# Patient Record
Sex: Male | Born: 1965 | Race: White | Hispanic: No | Marital: Single | Smoking: Never smoker
Health system: Southern US, Community
[De-identification: ages and names within clinical notes are randomized; demographics above are authoritative.]

## PROBLEM LIST (undated history)

## (undated) DIAGNOSIS — I4891 Unspecified atrial fibrillation: Secondary | ICD-10-CM

## (undated) DIAGNOSIS — D649 Anemia, unspecified: Secondary | ICD-10-CM

## (undated) DIAGNOSIS — M1990 Unspecified osteoarthritis, unspecified site: Secondary | ICD-10-CM

## (undated) DIAGNOSIS — N529 Male erectile dysfunction, unspecified: Secondary | ICD-10-CM

## (undated) DIAGNOSIS — D869 Sarcoidosis, unspecified: Secondary | ICD-10-CM

## (undated) DIAGNOSIS — I10 Essential (primary) hypertension: Secondary | ICD-10-CM

## (undated) HISTORY — PX: SHOULDER SURGERY: SHX246

## (undated) HISTORY — DX: Essential (primary) hypertension: I10

## (undated) HISTORY — PX: VASECTOMY: SHX75

## (undated) HISTORY — PX: OTHER SURGICAL HISTORY: SHX169

---

## 1988-05-07 HISTORY — PX: KNEE SURGERY: SHX244

## 2001-05-07 DIAGNOSIS — D869 Sarcoidosis, unspecified: Secondary | ICD-10-CM

## 2001-05-07 HISTORY — DX: Sarcoidosis, unspecified: D86.9

## 2012-02-15 ENCOUNTER — Ambulatory Visit: Payer: Self-pay | Admitting: Internal Medicine

## 2015-04-07 ENCOUNTER — Emergency Department (HOSPITAL_COMMUNITY)
Admission: EM | Admit: 2015-04-07 | Discharge: 2015-04-07 | Disposition: A | Discharge status: Home / Self Care | Payer: BLUE CROSS/BLUE SHIELD | Attending: Emergency Medicine | Admitting: Emergency Medicine

## 2015-04-07 ENCOUNTER — Encounter (HOSPITAL_COMMUNITY): Payer: Self-pay | Admitting: *Deleted

## 2015-04-07 ENCOUNTER — Emergency Department (HOSPITAL_COMMUNITY): Payer: BLUE CROSS/BLUE SHIELD

## 2015-04-07 DIAGNOSIS — R000 Tachycardia, unspecified: Secondary | ICD-10-CM | POA: Diagnosis present

## 2015-04-07 DIAGNOSIS — Z862 Personal history of diseases of the blood and blood-forming organs and certain disorders involving the immune mechanism: Secondary | ICD-10-CM | POA: Diagnosis not present

## 2015-04-07 DIAGNOSIS — I4891 Unspecified atrial fibrillation: Secondary | ICD-10-CM | POA: Diagnosis not present

## 2015-04-07 HISTORY — DX: Sarcoidosis, unspecified: D86.9

## 2015-04-07 HISTORY — DX: Anemia, unspecified: D64.9

## 2015-04-07 LAB — CBC WITH DIFFERENTIAL/PLATELET
Basophils Absolute: 0 10*3/uL (ref 0.0–0.1)
Basophils Relative: 0 %
EOS ABS: 0 10*3/uL (ref 0.0–0.7)
EOS PCT: 1 %
HCT: 36.4 % — ABNORMAL LOW (ref 39.0–52.0)
Hemoglobin: 11.8 g/dL — ABNORMAL LOW (ref 13.0–17.0)
LYMPHS ABS: 1.4 10*3/uL (ref 0.7–4.0)
LYMPHS PCT: 20 %
MCH: 30.5 pg (ref 26.0–34.0)
MCHC: 32.4 g/dL (ref 30.0–36.0)
MCV: 94.1 fL (ref 78.0–100.0)
MONO ABS: 0.5 10*3/uL (ref 0.1–1.0)
Monocytes Relative: 6 %
Neutro Abs: 5.3 10*3/uL (ref 1.7–7.7)
Neutrophils Relative %: 73 %
Platelets: 209 10*3/uL (ref 150–400)
RBC: 3.87 MIL/uL — AB (ref 4.22–5.81)
RDW: 12.7 % (ref 11.5–15.5)
WBC: 7.2 10*3/uL (ref 4.0–10.5)

## 2015-04-07 LAB — BASIC METABOLIC PANEL
ANION GAP: 8 (ref 5–15)
BUN: 8 mg/dL (ref 6–20)
CALCIUM: 8.1 mg/dL — AB (ref 8.9–10.3)
CO2: 25 mmol/L (ref 22–32)
Chloride: 106 mmol/L (ref 101–111)
Creatinine, Ser: 0.81 mg/dL (ref 0.61–1.24)
GFR calc Af Amer: >60 mL/min (ref >60)
Glucose, Bld: 91 mg/dL (ref 65–99)
POTASSIUM: 3.5 mmol/L (ref 3.5–5.1)
SODIUM: 139 mmol/L (ref 135–145)

## 2015-04-07 MED ORDER — DILTIAZEM HCL 25 MG/5ML IV SOLN
20.0000 mg | Freq: Once | INTRAVENOUS | Status: AC
  Administered 2015-04-07: 20 mg via INTRAVENOUS
  Filled 2015-04-07: qty 5

## 2015-04-07 MED ORDER — RIVAROXABAN 15 MG PO TABS: 15.0000 mg | ORAL_TABLET | Freq: Every day | ORAL | Status: DC

## 2015-04-07 MED ORDER — PROPOFOL 10 MG/ML IV BOLUS
1.0000 mg/kg | Freq: Once | INTRAVENOUS | Status: DC
  Filled 2015-04-07: qty 20

## 2015-04-07 MED ORDER — DILTIAZEM HCL ER COATED BEADS 120 MG PO CP24: 120.0000 mg | ORAL_CAPSULE | Freq: Every day | ORAL | Status: DC

## 2015-04-07 MED ORDER — RIVAROXABAN 15 MG PO TABS
15.0000 mg | ORAL_TABLET | Freq: Once | ORAL | Status: AC
  Administered 2015-04-07: 15 mg via ORAL
  Filled 2015-04-07: qty 1

## 2015-04-07 MED ORDER — PROPOFOL 10 MG/ML IV BOLUS
INTRAVENOUS | Status: AC | PRN
  Administered 2015-04-07: 122.5 mg via INTRAVENOUS

## 2015-04-07 MED ORDER — DILTIAZEM HCL ER COATED BEADS 120 MG PO CP24
120.0000 mg | ORAL_CAPSULE | Freq: Once | ORAL | Status: AC
  Administered 2015-04-07: 120 mg via ORAL
  Filled 2015-04-07: qty 1

## 2015-04-07 NOTE — ED Notes (Signed)
Pt states he began feeling like his chest was fluttering last night. Pt states it continued to "feel weird" and was only able to work 1/2 a day. Pt endorses SOB, but denies pain, diaphoresis or n/v. This is a new onset of afib, pt states he has had an irregular hr, but not afib.

## 2015-04-07 NOTE — Discharge Instructions (Signed)
Atrial Fibrillation Take the medication prescribed starting tomorrow. The cardiologist's office should call you to be seen within the next 1 or 2 weeks. If you don't hear from them by tomorrow afternoon, call to schedule an appointment. Tell office staff with Dr.Skains spoke with Dr.Nattaly Yebra, and that you need to be seen within 1 or 2 weeks. Return if you feel worse for any reason Atrial fibrillation is a type of heartbeat that is irregular or fast (rapid). If you have this condition, your heart keeps quivering in a weird (chaotic) way. This condition can make it so your heart cannot pump blood normally. Having this condition gives a person more risk for stroke, heart failure, and other heart problems. There are different types of atrial fibrillation. Talk with your doctor to learn about the type that you have. HOME CARE  Take over-the-counter and prescription medicines only as told by your doctor.  If your doctor prescribed a blood-thinning medicine, take it exactly as told. Taking too much of it can cause bleeding. If you do not take enough of it, you will not have the protection that you need against stroke and other problems.  Do not use any tobacco products. These include cigarettes, chewing tobacco, and e-cigarettes. If you need help quitting, ask your doctor.  If you have apnea (obstructive sleep apnea), manage it as told by your doctor.  Do not drink alcohol.  Do not drink beverages that have caffeine. These include coffee, soda, and tea.  Maintain a healthy weight. Do not use diet pills unless your doctor says they are safe for you. Diet pills may make heart problems worse.  Follow diet instructions as told by your doctor.  Exercise regularly as told by your doctor.  Keep all follow-up visits as told by your doctor. This is important. GET HELP IF:  You notice a change in the speed, rhythm, or strength of your heartbeat.  You are taking a blood-thinning medicine and you notice  more bruising.  You get tired more easily when you move or exercise. GET HELP RIGHT AWAY IF:  You have pain in your chest or your belly (abdomen).  You have sweating or weakness.  You feel sick to your stomach (nauseous).  You notice blood in your throw up (vomit), poop (stool), or pee (urine).  You are short of breath.  You suddenly have swollen feet and ankles.  You feel dizzy.  Your suddenly get weak or numb in your face, arms, or legs, especially if it happens on one side of your body.  You have trouble talking, trouble understanding, or both.  Your face or your eyelid droops on one side. These symptoms may be an emergency. Do not wait to see if the symptoms will go away. Get medical help right away. Call your local emergency services (911 in the [place] Do not drive yourself to the hospital.   This information is not intended to replace advice given to you by your health care provider. Make sure you discuss any questions you have with your health care provider.   Document Released: 01/31/2008 Document Revised: 01/12/2015 Document Reviewed: 08/18/2014 Elsevier Interactive Patient Education [employer]

## 2015-04-07 NOTE — ED Notes (Signed)
Pt left with all his belongings and ambulated out of the treatment area.  

## 2015-04-07 NOTE — ED Provider Notes (Addendum)
CSN: [id number]     Arrival date & time 04/07/15  1700 History   First MD Initiated Contact with Patient 04/07/15 1719     Chief Complaint  Patient presents with  . Tachycardia     (Consider location/radiation/quality/duration/timing/severity/associated sxs/prior Treatment) HPI C/o of "feeling tired" and his heart fluttering in his chest with rapid heartbeat onset 12 midnight yesterday.. Nothing makes symptoms better worse. He denies any chest pain denies is of breath. No other associated symptoms. He was seen at his primary care physician's office today to determine to be in atrial fib with rapid ventricular response. Sent here for further evaluation Past Medical History  Diagnosis Date  . Sarcoidosis (HCC) 2003  . Anemia    History reviewed. No pertinent past surgical history. History reviewed. No pertinent family history. Social History  Substance Use Topics  . Smoking status: Never Smoker   . Smokeless tobacco: Never Used  . Alcohol Use: 24.6 oz/week    41 Cans of beer per week    Review of Systems  HENT: Negative.   Respiratory: Negative.   Cardiovascular: Positive for palpitations.  Gastrointestinal: Negative.   Musculoskeletal: Negative.   Skin: Negative.   Neurological: Positive for weakness.       Generalized weakness  Psychiatric/Behavioral: Negative.   All other systems reviewed and are negative.     Allergies  Review of patient's allergies indicates no known allergies.  Home Medications   Prior to Admission medications   Not on File   BP 137/107 mmHg  Pulse 69  Temp(Src) 98.8 F (37.1 C) (Oral)  Resp 12  Ht  (1.905 m)  Wt 270 lb (122.471 kg)  BMI 33.75 kg/m2  SpO2 97% Physical Exam  Constitutional: He appears well-developed and well-nourished.  HENT:  Head: Normocephalic and atraumatic.  Eyes: Conjunctivae are normal. Pupils are equal, round, and reactive to light.  Neck: Neck supple. No tracheal deviation present. No thyromegaly  present.  Cardiovascular:  No murmur heard. Tachycardic irregularly irregular  Pulmonary/Chest: Effort normal and breath sounds normal.  Abdominal: Soft. Bowel sounds are normal. He exhibits no distension. There is no tenderness.  Musculoskeletal: Normal range of motion. He exhibits no edema or tenderness.  Neurological: He is alert. Coordination normal.  Skin: Skin is warm and dry. No rash noted.  Psychiatric: He has a normal mood and affect.  Nursing note and vitals reviewed.   ED Course  .Sedation Date/Time: 04/07/2015 8:06 PM Performed by: Doug Sou Authorized by: Doug Sou  Consent:    Consent obtained:  Written   Consent given by:  Patient   Risks discussed:  Allergic reaction and prolonged hypoxia resulting in organ damage Indications:    Sedation purpose:  Cardioversion   Procedure necessitating sedation performed by:  Physician performing sedation   Intended level of sedation:  Deep Pre-sedation assessment:    Time since last food or drink:  11 am today   ASA classification: class 2 - patient with mild systemic disease     Neck mobility: normal     Mouth opening:  3 or more finger widths   Thyromental distance:  3 finger widths   Mallampati score:  I - soft palate, uvula, fauces, pillars visible   Pre-sedation assessments completed and reviewed: airway patency, cardiovascular function, hydration status, mental status and respiratory function     Pre-sedation assessment completed:  04/07/2015 7:43 PM Immediate pre-procedure details:    Reassessment: Patient reassessed immediately prior to procedure     Reviewed: vital  signs, relevant labs/tests and NPO status     Verified: bag valve mask available, intubation equipment available and oxygen available   Procedure details (see MAR for exact dosages):    Sedation start time:  04/07/2015 7:43 PM   Preoxygenation:  Nasal cannula   Sedation:  Propofol   Analgesia:  None   Intra-procedure monitoring:  Blood  pressure monitoring, continuous pulse oximetry, frequent LOC assessments and frequent vital sign checks   Intra-procedure events: hypoxia     Intra-procedure management:  Supplemental oxygen Post-procedure details:    Post-sedation assessment completed:  04/07/2015 8:11 PM   Attendance: Constant attendance by certified staff until patient recovered     Recovery: Patient returned to pre-procedure baseline     Post-sedation assessments completed and reviewed: airway patency, cardiovascular function, mental status, pain level and respiratory function     Patient is stable for discharge or admission: Yes     Patient tolerance:  Tolerated well, no immediate complications Comments:      Pulse ox desated to 89 % for < 1 minute .  (including critical care time) correction pulse ox desatted 89% for less than 1 minute during procedure. Corrected to 95% with supplemental oxygen administered via nasal prong Labs Review Labs Reviewed  CBC WITH DIFFERENTIAL/PLATELET  BASIC METABOLIC PANEL    Imaging Review No results found. I have personally reviewed and evaluated these images and lab results as part of my medical decision-making.   EKG Interpretation   Date/Time:  Thursday April 07 2015 18:00:27 EST Ventricular Rate:  145 PR Interval:    QRS Duration: 86 QT Interval:  294 QTC Calculation: 457 R Axis:   62 Text Interpretation:  Atrial fibrillation Anteroseptal infarct, age  indeterminate No old tracing to compare Confirmed by Coleen Cardiff  MD, Hawkins Seaman  [phone]) on 04/07/2015 6:10:26 PM      ED ECG REPORT   Date: 04/07/2015   1956 pm  Rate: 95  Rhythm: normal sinus rhythm  QRS Axis: normal  Intervals: normal  ST/T Wave abnormalities: normal  Conduction Disutrbances:none  Narrative Interpretation:   Old EKG Reviewed: changes noted Atrial fibrillation has resolved I have personally reviewed the EKG tracing and disagree with the computerized printout as noted. Doubt posterior MI 173 9 PM  patient received Cardizem 20 mg IV, my order at 1940 4 PM patient is asymptomatic pulse 129 respirations 16 blood pressure 130/93. Patient was subsequently cardioverted by me. Consent obtained, timeout performed ASA category 2. Last oral intake 11 AM today  Results for orders placed or performed during the hospital encounter of 04/07/15  CBC with Differential/Platelet  Result Value Ref Range   WBC 7.2 4.0 - 10.5 K/uL   RBC 3.87 (L) 4.22 - 5.81 MIL/uL   Hemoglobin 11.8 (L) 13.0 - 17.0 g/dL   HCT 19.3 (L) 79.0 - 24.0 %   MCV 94.1 78.0 - 100.0 fL   MCH 30.5 26.0 - 34.0 pg   MCHC 32.4 30.0 - 36.0 g/dL   RDW 97.3 53.2 - 99.2 %   Platelets 209 150 - 400 K/uL   Neutrophils Relative % 73 %   Neutro Abs 5.3 1.7 - 7.7 K/uL   Lymphocytes Relative 20 %   Lymphs Abs 1.4 0.7 - 4.0 K/uL   Monocytes Relative 6 %   Monocytes Absolute 0.5 0.1 - 1.0 K/uL   Eosinophils Relative 1 %   Eosinophils Absolute 0.0 0.0 - 0.7 K/uL   Basophils Relative 0 %   Basophils Absolute 0.0 0.0 -  0.1 K/uL  Basic metabolic panel  Result Value Ref Range   Sodium 139 135 - 145 mmol/L   Potassium 3.5 3.5 - 5.1 mmol/L   Chloride 106 101 - 111 mmol/L   CO2 25 22 - 32 mmol/L   Glucose, Bld 91 65 - 99 mg/dL   BUN 8 6 - 20 mg/dL   Creatinine, Ser 1.61 0.61 - 1.24 mg/dL   Calcium 8.1 (L) 8.9 - 10.3 mg/dL   GFR calc non Af Amer >60 >60 mL/min   GFR calc Af Amer >60 >60 mL/min   Anion gap 8 5 - 15   Dg Chest Port 1 View  04/07/2015  CLINICAL DATA:  Palpitations, shortness of breath for 1 day EXAM: PORTABLE CHEST 1 VIEW COMPARISON:  None. FINDINGS: The heart size and mediastinal contours are within normal limits. Both lungs are clear. The visualized skeletal structures are unremarkable. IMPRESSION: No active disease. Electronically Signed   By: Natasha Mead M.D.   On: 04/07/2015 18:35    MDM  Spoke with Dr. Roe [place] prescription Cardizem CD 120 mg daily for one month,xarelto 15 mg daily for one month. Cardiology office  will see patient within the next 1-2 weeks Final diagnoses:  None   Diagnosis Atrial fibrillation with rapid ventricular response    Doug Sou, MD 04/07/15 2029  Doug Sou, MD 04/07/15 2029  Doug Sou, MD 04/07/15 2039

## 2015-04-08 ENCOUNTER — Telehealth: Payer: Self-pay | Admitting: *Deleted

## 2015-04-08 NOTE — Telephone Encounter (Signed)
Spoke with pt to follow up with him and how he is feeling.  He reports being very tired today.  He was at the pharmacy to pick up the medications he was started on last night while at the ED.  He was scheduled for f/u with Dr Anne Fu on 12/28.  Dr Anne Fu states OK for pt to been seen then but to call back if further concerns or problems prior to then.

## 2015-04-25 ENCOUNTER — Encounter: Payer: Self-pay | Admitting: Cardiology

## 2015-04-27 ENCOUNTER — Ambulatory Visit: Payer: BLUE CROSS/BLUE SHIELD | Admitting: Physician Assistant

## 2015-05-04 ENCOUNTER — Ambulatory Visit (INDEPENDENT_AMBULATORY_CARE_PROVIDER_SITE_OTHER): Payer: BLUE CROSS/BLUE SHIELD | Admitting: Cardiology

## 2015-05-04 ENCOUNTER — Encounter: Payer: Self-pay | Admitting: Cardiology

## 2015-05-04 VITALS — BP 150/84 | HR 87 | Ht 75.0 in | Wt 271.4 lb

## 2015-05-04 DIAGNOSIS — I480 Paroxysmal atrial fibrillation: Secondary | ICD-10-CM

## 2015-05-04 DIAGNOSIS — E669 Obesity, unspecified: Secondary | ICD-10-CM

## 2015-05-04 MED ORDER — DILTIAZEM HCL ER COATED BEADS 240 MG PO CP24: 240.0000 mg | ORAL_CAPSULE | Freq: Every day | ORAL | Status: DC

## 2015-05-04 NOTE — Progress Notes (Signed)
Cardiology Office Note   Date:  05/04/2015   ID:  Gerald Robles, DOB Dec 13, 1965, MRN [id number]  PCP:  Pcp Not In System  Cardiologist:   Donato Schultz, MD       History of Present Illness: Gerald Robles is a 49 y.o. male who presents  Evaluation of atrial fibrillation, paroxysmal.  At the request of Dr. Kevan Ny. Cardizem CD 120 mg once a day was given after cardioversion took place. He has been on Xarelto prescribed for one month post cardioversion.   he admits that over Thanksgiving , he had been partying a bit much. Increased alcohol use. His heart rate was increased with his girlfriend.  Noticed some shortness of breath. Went to sleep. Next morning still elevated. Went to the emergency room. No chest pain, no syncope , no fevers. He has cut back on alcohol use , caffeine use.   Since cardioversion on 04/07/15 he has felt better. Rare palpitations.  Back in 2006  in the [employer]	[place], he developed sarcoidosis and was treated with prednisone and indomethacin. During that experience he remembers getting a transesophageal echocardiogram which he remembers having an ejection fraction of 45%, mildly reduced.  He knows Olegario Messier, Dr. Kevan Ny nurse    Past Medical History  Diagnosis Date  . Sarcoidosis (HCC) 2003  . Anemia   . Hypertension   . Anemia     Past Surgical History  Procedure Laterality Date  . Knee surgery Bilateral 1990  . Shoulder surgery      twice     Current Outpatient Prescriptions  Medication Sig Dispense Refill  . diclofenac (VOLTAREN) 25 MG EC tablet Take 25 mg by mouth 2 (two) times daily.    Marland Kitchen diltiazem (CARDIZEM CD) 240 MG 24 hr capsule Take 1 capsule (240 mg total) by mouth daily. 30 capsule 11  . Rivaroxaban (XARELTO) 15 MG TABS tablet Take 1 tablet (15 mg total) by mouth daily. 30 tablet 0   No current facility-administered medications for this visit.    Allergies:   Review of patient's allergies indicates no known allergies.    Social History:  The  patient  reports that he has never smoked. He has never used smokeless tobacco. He reports that he drinks about 24.6 oz of alcohol per week. He reports that he uses illicit drugs (Marijuana).   Family History:  The patient's family history includes Hypertension in his mother.    ROS:  Please see the history of present illness.   Otherwise, review of systems are positive for none.   All other systems are reviewed and negative.    PHYSICAL EXAM: VS:  BP 150/84 mmHg  Pulse 87  Ht  (1.905 m)  Wt 271 lb 6.4 oz (123.106 kg)  BMI 33.92 kg/m2  SpO2 96% , BMI Body mass index is 33.92 kg/(m^2). GEN: Well nourished, well developed, in no acute distress HEENT: normal Neck: no JVD, carotid bruits, or masses Cardiac: RRR; no murmurs, rubs, or gallops,no edema  Respiratory:  clear to auscultation bilaterally, normal work of breathing GI: soft, nontender, nondistended, + BS MS: no deformity or atrophy Skin: warm and dry, no rash Neuro:  Strength and sensation are intact Psych: euthymic mood, full affect   EKG:  EKG is not ordered today.  EKGs from 04/07/15 personally reviewed and shows atrial fibrillation with rapid ventricular response in the 140 range, second EKG shows sinus rhythm no other abnormalities.     Recent Labs: 04/07/2015: BUN 8; Creatinine, Ser 0.81;  Hemoglobin 11.8*; Platelets 209; Potassium 3.5; Sodium 139    Lipid Panel No results found for: CHOL, TRIG, HDL, CHOLHDL, VLDL, LDLCALC, LDLDIRECT    Wt Readings from Last 3 Encounters:  05/04/15 271 lb 6.4 oz (123.106 kg)  04/07/15 270 lb (122.471 kg)      Other studies Reviewed: Additional studies/ records that were reviewed today include:  Prior emergency room visits reviewed, lab work reviewed , EKG reviewed. Review of the above records demonstrates:  As above   ASSESSMENT AND PLAN:   Paroxysmal atrial fibrillation  -  Cardioversion took place on 04/07/15 in the emergency room  -  Xarelto for one month post  cardioversion  -  Cardizem we will increase to 240 mg once a day  -  He is decreasing alcohol use , caffeine use. Used to drink 8 Coca[employer]Cola's a day.  - CHDS-VASc is 1 ( no long-term anticoagulation)  -  He does not snore   Essential hypertension  -  Increasing diltiazem  -  May need secondary agent  -  Weight loss   Obesity  -  Weight loss  -  This will help with atrial fibrillation as well   Current medicines are reviewed at length with the patient today.  The patient does not have concerns regarding medicines.  The following changes have been made:   Increasing Cardizem  Labs/ tests ordered today include:   Orders Placed This Encounter  Procedures  . Echocardiogram     Disposition:   FU with Myers Tutterow in 3 months  Signed, Donato Schultz, MD  05/04/2015 4:43 PM    [facility] [address] [place], [place], [place]  [address] Phone: [phone]; Fax: [phone]

## 2015-05-04 NOTE — Patient Instructions (Signed)
Medication Instructions:  Please increase your Diltiazem to 240 mg a day. Continue all other medications as listed.  Testing/Procedures: Your physician has requested that you have an echocardiogram. Echocardiography is a painless test that uses sound waves to create images of your heart. It provides your doctor with information about the size and shape of your heart and how well your heart's chambers and valves are working. This procedure takes approximately one hour. There are no restrictions for this procedure.  Follow-Up: Follow up in 3 months with Dr Anne Fu.  If you need a refill on your cardiac medications before your next appointment, please call your pharmacy.  Thank you for choosing [facility]!!

## 2015-05-20 ENCOUNTER — Ambulatory Visit (HOSPITAL_COMMUNITY): Payer: BLUE CROSS/BLUE SHIELD | Attending: Cardiovascular Disease

## 2015-05-20 ENCOUNTER — Other Ambulatory Visit: Payer: Self-pay

## 2015-05-20 DIAGNOSIS — Z6833 Body mass index (BMI) 33.0-33.9, adult: Secondary | ICD-10-CM | POA: Insufficient documentation

## 2015-05-20 DIAGNOSIS — I480 Paroxysmal atrial fibrillation: Secondary | ICD-10-CM

## 2015-05-20 DIAGNOSIS — E669 Obesity, unspecified: Secondary | ICD-10-CM | POA: Diagnosis not present

## 2015-05-20 DIAGNOSIS — I4891 Unspecified atrial fibrillation: Secondary | ICD-10-CM | POA: Diagnosis present

## 2015-05-20 DIAGNOSIS — I517 Cardiomegaly: Secondary | ICD-10-CM | POA: Insufficient documentation

## 2015-05-25 ENCOUNTER — Telehealth: Payer: Self-pay | Admitting: *Deleted

## 2015-05-25 NOTE — Telephone Encounter (Signed)
Notes Recorded by Loa Socks, LPN on 08/21/3843 at 9:01 AM Notified the pt that per Dr Anne Fu, his echo showed mildly reduced EF 45-50 %, dilated left atrium (due to a-fib), and he advises that the pt should continue on his current medication regimen, and watch his ETOH intake.  Pt verbalized understanding and request that Xarelto be removed from his medication list, for this med was discontinued by Dr Anne Fu after one month post cardioversion.  Confirmed this med was discontinued by Dr Anne Fu, noted in his last OV with the pt on 05/04/15.  Informed the pt I will d/c this out of his med list.  Pt verbalized understanding and gracious for all the assistance provided.

## 2015-05-25 NOTE — Telephone Encounter (Signed)
-----   Message from Jake Bathe, MD sent at 05/23/2015  6:52 AM EST ----- Mildly reduced EF 45-50% Dilated left atrium (AFIB) Continue with current medical mgt. Watch ETOH consumption Donato Schultz, MD

## 2015-06-28 ENCOUNTER — Encounter: Payer: Self-pay | Admitting: Cardiology

## 2015-06-28 ENCOUNTER — Ambulatory Visit (INDEPENDENT_AMBULATORY_CARE_PROVIDER_SITE_OTHER): Payer: BLUE CROSS/BLUE SHIELD | Admitting: Cardiology

## 2015-06-28 VITALS — BP 134/86 | HR 76 | Ht 75.0 in | Wt 272.0 lb

## 2015-06-28 DIAGNOSIS — E669 Obesity, unspecified: Secondary | ICD-10-CM | POA: Diagnosis not present

## 2015-06-28 DIAGNOSIS — I480 Paroxysmal atrial fibrillation: Secondary | ICD-10-CM | POA: Diagnosis not present

## 2015-06-28 NOTE — Progress Notes (Signed)
Cardiology Office Note   Date:  06/28/2015   ID:  Gerald Robles, DOB [birth year], MRN [id number]  PCP:  Pcp Not In System  Cardiologist:   Donato Schultz, MD       History of Present Illness: Gerald Robles is a 50 y.o. male who presents  Evaluation of atrial fibrillation, paroxysmal.  At the request of Dr. Kevan Ny. Cardizem CD 120 mg once a day was given after cardioversion took place.Took Xarelto prescribed for one month post cardioversion.  He admits that over Thanksgiving, he had been partying a bit much. Increased alcohol use. His heart rate was increased with his girlfriend.  Noticed some shortness of breath. Went to sleep. Next morning still elevated. Went to the emergency room. No chest pain, no syncope, no fevers. He has cut back on alcohol use , caffeine use.  Since cardioversion on 04/07/15 he has felt better. Rare palpitations. he however feels somewhat run down, more fatigue since being on diltiazem.   Back in 2006  in the [employer]	[place], he developed sarcoidosis and was treated with prednisone and indomethacin. During that experience he remembers getting a transesophageal echocardiogram which he remembers having an ejection fraction of 45, possibly 50 %, mildly reduced. This may been long-standing. He had to get a letter certifying his ability to be on a flights suqadron.  He knows Olegario Messier, Dr. Kevan Ny' nurse.    Past Medical History  Diagnosis Date  . Sarcoidosis (HCC) 2003  . Anemia   . Hypertension   . Anemia     Past Surgical History  Procedure Laterality Date  . Knee surgery Bilateral 1990  . Shoulder surgery      twice     Current Outpatient Prescriptions  Medication Sig Dispense Refill  . diclofenac (VOLTAREN) 25 MG EC tablet Take 25 mg by mouth 2 (two) times daily.    Marland Kitchen diltiazem (CARDIZEM CD) 240 MG 24 hr capsule Take 1 capsule (240 mg total) by mouth daily. 30 capsule 11   No current facility-administered medications for this visit.    Allergies:   Review  of patient's allergies indicates no known allergies.    Social History:  The patient  reports that he has never smoked. He has never used smokeless tobacco. He reports that he drinks about 24.6 oz of alcohol per week. He reports that he uses illicit drugs (Marijuana).   Family History:  The patient's family history includes Hypertension in his mother.    ROS:  Please see the history of present illness.   Otherwise, review of systems are positive for none.   All other systems are reviewed and negative.    PHYSICAL EXAM: VS:  BP 134/86 mmHg  Pulse 76  Ht 6\' 3"  (1.905 m)  Wt 272 lb (123.378 kg)  BMI 34.00 kg/m2 , BMI Body mass index is 34 kg/(m^2). GEN: Well nourished, well developed, in no acute distress HEENT: normal Neck: no JVD, carotid bruits, or masses Cardiac: RRR; no murmurs, rubs, or gallops,no edema  Respiratory:  clear to auscultation bilaterally, normal work of breathing GI: soft, nontender, nondistended, + BS MS: no deformity or atrophy Skin: warm and dry, no rash Neuro:  Strength and sensation are intact Psych: euthymic mood, full affect   EKG:  EKG is not ordered today.  EKGs from 04/07/15 personally reviewed and shows atrial fibrillation with rapid ventricular response in the 140 range, second EKG shows sinus rhythm no other abnormalities.     Recent Labs: 04/07/2015: BUN 8; Creatinine,  Ser 0.81; Hemoglobin 11.8*; Platelets 209; Potassium 3.5; Sodium 139    Lipid Panel No results found for: CHOL, TRIG, HDL, CHOLHDL, VLDL, LDLCALC, LDLDIRECT    Wt Readings from Last 3 Encounters:  06/28/15 272 lb (123.378 kg)  05/04/15 271 lb 6.4 oz (123.106 kg)  04/07/15 270 lb (122.471 kg)      Other studies Reviewed: Additional studies/ records that were reviewed today include:  Prior emergency room visits reviewed, lab work reviewed , EKG reviewed. Review of the above records demonstrates:  As above   ASSESSMENT AND PLAN:   Paroxysmal atrial fibrillation  -   Cardioversion took place on 04/07/15 in the emergency room  -  Xarelto for one month post cardioversion  -  Cardizem was increased to 240 mg once a day previously to help with blood pressure. He desires to come off this medication because he states that is making feel fatigued. I'm fine with this since he is not having any further episodes of atrial fibrillation. I told him to make sure that he has an appointment with Dr. Kevan Ny coming up to ensure that his blood pressure is under good control. He understands. He also understands that alcohol use can trigger atrial fibrillation. Alcohol can also decrease ejection fraction for which his is mildly reduced. 45-50%. 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   -  He is decreasing alcohol use , caffeine use. Used to drink 8 Coca-Cola's a day.  -  CHDS-VASc is 1 ( no long-term anticoagulation)  -  He does not snore  - Suggestion would be if he feels rapid palpitations to take a diltiazem 240 mg and hopefully over the next few minutes to hours is 8 for ablation will break. If not, he knows to seek medical attention.   Essential hypertension  -  encouraged him to seek the attention of Dr. Kevan Ny.   -  May need secondary agent  -  Weight loss, he desires to lose 40 pounds    Obesity  -  Weight loss  -  This will help with atrial fibrillation as well   Current medicines are reviewed at length with the patient today.  The patient does not have concerns regarding medicines.  The following changes have been made:   Increasing Cardizem  Labs/ tests ordered today include:   No orders of the defined types were placed in this encounter.     Disposition:   FU with Lasya Vetter in 3 months  Signed, Donato Schultz, MD  06/28/2015 8:05 AM    [facility] [address] [place], [place], [place]  [address] Phone: [phone]; Fax: [phone]

## 2015-06-28 NOTE — Patient Instructions (Signed)
Medication Instructions:  Taper Diltiazem one every other day for 1 week then stop. Continue all other medications as listed.  Follow-Up: Follow up as needed with Dr Anne Fu.  Follow with Dr Kevan Ny or your primary care doctor for your blood pressure.  If you need a refill on your cardiac medications before your next appointment, please call your pharmacy.  Thank you for choosing [facility]!!

## 2015-07-25 ENCOUNTER — Ambulatory Visit: Payer: BLUE CROSS/BLUE SHIELD | Admitting: Cardiology

## 2016-07-06 ENCOUNTER — Ambulatory Visit: Payer: Self-pay | Admitting: Orthopedic Surgery

## 2016-07-12 ENCOUNTER — Encounter (HOSPITAL_COMMUNITY): Payer: Self-pay | Admitting: *Deleted

## 2016-07-20 ENCOUNTER — Encounter (HOSPITAL_COMMUNITY): Payer: Self-pay

## 2016-07-20 ENCOUNTER — Other Ambulatory Visit (HOSPITAL_COMMUNITY): Payer: Self-pay | Admitting: Emergency Medicine

## 2016-07-20 NOTE — Patient Instructions (Signed)
Octavien Faggart  07/20/2016   Your procedure is scheduled on: 08-09-16  Report to [facility] Main  Entrance take [facility]  elevators to 3rd floor to  Short Stay Center at [phone].  Call this number if you have problems the morning of surgery [phone]   Remember: ONLY 1 PERSON MAY GO WITH YOU TO SHORT STAY TO GET  READY MORNING OF YOUR SURGERY.  Do not eat food or drink liquids :After Midnight.     Take these medicines the morning of surgery with A SIP OF WATER: tylenol as needed                                You may not have any metal on your body including hair pins and              piercings  Do not wear jewelry, make-up, lotions, powders or perfumes, deodorant             Do not wear nail polish.  Do not shave  48 hours prior to surgery.              Men may shave face and neck.   Do not bring valuables to the hospital. [facility] IS NOT             RESPONSIBLE   FOR VALUABLES.  Contacts, dentures or bridgework may not be worn into surgery.  Leave suitcase in the car. After surgery it may be brought to your room.               Please read over the following fact sheets you were given: _____________________________________________________________________             [facility] - Preparing for Surgery Before surgery, you can play an important role.  Because skin is not sterile, your skin needs to be as free of germs as possible.  You can reduce the number of germs on your skin by washing with CHG (chlorahexidine gluconate) soap before surgery.  CHG is an antiseptic cleaner which kills germs and bonds with the skin to continue killing germs even after washing. Please DO NOT use if you have an allergy to CHG or antibacterial soaps.  If your skin becomes reddened/irritated stop using the CHG and inform your nurse when you arrive at Short Stay. Do not shave (including legs and underarms) for at least 48 hours prior to the first CHG shower.  You may shave your  face/neck. Please follow these instructions carefully:  1.  Shower with CHG Soap the night before surgery and the  morning of Surgery.  2.  If you choose to wash your hair, wash your hair first as usual with your  normal  shampoo.  3.  After you shampoo, rinse your hair and body thoroughly to remove the  shampoo.                           4.  Use CHG as you would any other liquid soap.  You can apply chg directly  to the skin and wash                       Gently with a scrungie or clean washcloth.  5.  Apply the CHG Soap to your body  ONLY FROM THE NECK DOWN.   Do not use on face/ open                           Wound or open sores. Avoid contact with eyes, ears mouth and genitals (private parts).                       Wash face,  Genitals (private parts) with your normal soap.             6.  Wash thoroughly, paying special attention to the area where your surgery  will be performed.  7.  Thoroughly rinse your body with warm water from the neck down.  8.  DO NOT shower/wash with your normal soap after using and rinsing off  the CHG Soap.                9.  Pat yourself dry with a clean towel.            10.  Wear clean pajamas.            11.  Place clean sheets on your bed the night of your first shower and do not  sleep with pets. Day of Surgery : Do not apply any lotions/deodorants the morning of surgery.  Please wear clean clothes to the hospital/surgery center.  FAILURE TO FOLLOW THESE INSTRUCTIONS MAY RESULT IN THE CANCELLATION OF YOUR SURGERY PATIENT SIGNATURE_________________________________  NURSE SIGNATURE__________________________________  ________________________________________________________________________   Adam Phenix  An incentive spirometer is a tool that can help keep your lungs clear and active. This tool measures how well you are filling your lungs with each breath. Taking long deep breaths may help reverse or decrease the chance of developing breathing  (pulmonary) problems (especially infection) following:  A long period of time when you are unable to move or be active. BEFORE THE PROCEDURE   If the spirometer includes an indicator to show your best effort, your nurse or respiratory therapist will set it to a desired goal.  If possible, sit up straight or lean slightly forward. Try not to slouch.  Hold the incentive spirometer in an upright position. INSTRUCTIONS FOR USE  1. Sit on the edge of your bed if possible, or sit up as far as you can in bed or on a chair. 2. Hold the incentive spirometer in an upright position. 3. Breathe out normally. 4. Place the mouthpiece in your mouth and seal your lips tightly around it. 5. Breathe in slowly and as deeply as possible, raising the piston or the ball toward the top of the column. 6. Hold your breath for 3-5 seconds or for as long as possible. Allow the piston or ball to fall to the bottom of the column. 7. Remove the mouthpiece from your mouth and breathe out normally. 8. Rest for a few seconds and repeat Steps 1 through 7 at least 10 times every 1-2 hours when you are awake. Take your time and take a few normal breaths between deep breaths. 9. The spirometer may include an indicator to show your best effort. Use the indicator as a goal to work toward during each repetition. 10. After each set of 10 deep breaths, practice coughing to be sure your lungs are clear. If you have an incision (the cut made at the time of surgery), support your incision when coughing by placing a pillow or rolled up towels firmly against it. Once  you are able to get out of bed, walk around indoors and cough well. You may stop using the incentive spirometer when instructed by your caregiver.  RISKS AND COMPLICATIONS  Take your time so you do not get dizzy or light-headed.  If you are in pain, you may need to take or ask for pain medication before doing incentive spirometry. It is harder to take a deep breath if you  are having pain. AFTER USE  Rest and breathe slowly and easily.  It can be helpful to keep track of a log of your progress. Your caregiver can provide you with a simple table to help with this. If you are using the spirometer at home, follow these instructions: Ord IF:   You are having difficultly using the spirometer.  You have trouble using the spirometer as often as instructed.  Your pain medication is not giving enough relief while using the spirometer.  You develop fever of 100.5 F (38.1 C) or higher. SEEK IMMEDIATE MEDICAL CARE IF:   You cough up bloody sputum that had not been present before.  You develop fever of 102 F (38.9 C) or greater.  You develop worsening pain at or near the incision site. MAKE SURE YOU:   Understand these instructions.  Will watch your condition.  Will get help right away if you are not doing well or get worse. Document Released: 09/03/2006 Document Revised: 07/16/2011 Document Reviewed: 11/04/2006 ExitCare Patient Information 2014 Exit[employer], [place].   ________________________________________________________________________  WHAT IS A BLOOD TRANSFUSION? Blood Transfusion Information  A transfusion is the replacement of blood or some of its parts. Blood is made up of multiple cells which provide different functions.  Red blood cells carry oxygen and are used for blood loss replacement.  White blood cells fight against infection.  Platelets control bleeding.  Plasma helps clot blood.  Other blood products are available for specialized needs, such as hemophilia or other clotting disorders. BEFORE THE TRANSFUSION  Who gives blood for transfusions?   Healthy volunteers who are fully evaluated to make sure their blood is safe. This is blood bank blood. Transfusion therapy is the safest it has ever been in the practice of medicine. Before blood is taken from a donor, a complete history is taken to make sure that person has  no history of diseases nor engages in risky social behavior (examples are intravenous drug use or sexual activity with multiple partners). The donor's travel history is screened to minimize risk of transmitting infections, such as malaria. The donated blood is tested for signs of infectious diseases, such as HIV and hepatitis. The blood is then tested to be sure it is compatible with you in order to minimize the chance of a transfusion reaction. If you or a relative donates blood, this is often done in anticipation of surgery and is not appropriate for emergency situations. It takes many days to process the donated blood. RISKS AND COMPLICATIONS Although transfusion therapy is very safe and saves many lives, the main dangers of transfusion include:   Getting an infectious disease.  Developing a transfusion reaction. This is an allergic reaction to something in the blood you were given. Every precaution is taken to prevent this. The decision to have a blood transfusion has been considered carefully by your caregiver before blood is given. Blood is not given unless the benefits outweigh the risks. AFTER THE TRANSFUSION  Right after receiving a blood transfusion, you will usually feel much better and more energetic. This  is especially true if your red blood cells have gotten low (anemic). The transfusion raises the level of the red blood cells which carry oxygen, and this usually causes an energy increase.  The nurse administering the transfusion will monitor you carefully for complications. HOME CARE INSTRUCTIONS  No special instructions are needed after a transfusion. You may find your energy is better. Speak with your caregiver about any limitations on activity for underlying diseases you may have. SEEK MEDICAL CARE IF:   Your condition is not improving after your transfusion.  You develop redness or irritation at the intravenous (IV) site. SEEK IMMEDIATE MEDICAL CARE IF:  Any of the following  symptoms occur over the next 12 hours:  Shaking chills.  You have a temperature by mouth above 102 F (38.9 C), not controlled by medicine.  Chest, back, or muscle pain.  People around you feel you are not acting correctly or are confused.  Shortness of breath or difficulty breathing.  Dizziness and fainting.  You get a rash or develop hives.  You have a decrease in urine output.  Your urine turns a dark color or changes to pink, red, or brown. Any of the following symptoms occur over the next 10 days:  You have a temperature by mouth above 102 F (38.9 C), not controlled by medicine.  Shortness of breath.  Weakness after normal activity.  The white part of the eye turns yellow (jaundice).  You have a decrease in the amount of urine or are urinating less often.  Your urine turns a dark color or changes to pink, red, or brown. Document Released: 04/20/2000 Document Revised: 07/16/2011 Document Reviewed: 12/08/2007 [facility] Patient Information 2014 [place], [place].  _______________________________________________________________________

## 2016-07-20 NOTE — Progress Notes (Signed)
Cardiac clearance Dr Anne Fu on chart LOV Dr Kevan Ny 06-14-16 chart Medical clearance Kevan Ny 12-21-15 chart ECHO 05-20-15 epic

## 2016-07-24 ENCOUNTER — Encounter (HOSPITAL_COMMUNITY): Payer: Self-pay

## 2016-07-24 ENCOUNTER — Ambulatory Visit: Payer: Self-pay | Admitting: Orthopedic Surgery

## 2016-07-24 ENCOUNTER — Encounter (HOSPITAL_COMMUNITY)
Admission: RE | Admit: 2016-07-24 | Discharge: 2016-07-24 | Disposition: A | Discharge status: Home / Self Care | Payer: BLUE CROSS/BLUE SHIELD | Source: Ambulatory Visit | Attending: Orthopedic Surgery | Admitting: Orthopedic Surgery

## 2016-07-24 ENCOUNTER — Encounter (INDEPENDENT_AMBULATORY_CARE_PROVIDER_SITE_OTHER): Payer: Self-pay

## 2016-07-24 DIAGNOSIS — I10 Essential (primary) hypertension: Secondary | ICD-10-CM | POA: Insufficient documentation

## 2016-07-24 DIAGNOSIS — Z01818 Encounter for other preprocedural examination: Secondary | ICD-10-CM | POA: Diagnosis present

## 2016-07-24 DIAGNOSIS — Z01810 Encounter for preprocedural cardiovascular examination: Secondary | ICD-10-CM | POA: Diagnosis not present

## 2016-07-24 DIAGNOSIS — M13862 Other specified arthritis, left knee: Secondary | ICD-10-CM | POA: Diagnosis not present

## 2016-07-24 HISTORY — DX: Male erectile dysfunction, unspecified: N52.9

## 2016-07-24 HISTORY — DX: Unspecified atrial fibrillation: I48.91

## 2016-07-24 HISTORY — DX: Unspecified osteoarthritis, unspecified site: M19.90

## 2016-07-24 LAB — BASIC METABOLIC PANEL
Anion gap: 10 (ref 5–15)
BUN: 9 mg/dL (ref 6–20)
CALCIUM: 9.1 mg/dL (ref 8.9–10.3)
CO2: 24 mmol/L (ref 22–32)
CREATININE: 0.66 mg/dL (ref 0.61–1.24)
Chloride: 104 mmol/L (ref 101–111)
Glucose, Bld: 91 mg/dL (ref 65–99)
Potassium: 4.3 mmol/L (ref 3.5–5.1)
SODIUM: 138 mmol/L (ref 135–145)

## 2016-07-24 LAB — CBC
HEMATOCRIT: 37.7 % — AB (ref 39.0–52.0)
HEMOGLOBIN: 13 g/dL (ref 13.0–17.0)
MCH: 30.4 pg (ref 26.0–34.0)
MCHC: 34.5 g/dL (ref 30.0–36.0)
MCV: 88.3 fL (ref 78.0–100.0)
Platelets: 240 10*3/uL (ref 150–400)
RBC: 4.27 MIL/uL (ref 4.22–5.81)
RDW: 12.4 % (ref 11.5–15.5)
WBC: 3.9 10*3/uL — ABNORMAL LOW (ref 4.0–10.5)

## 2016-07-24 LAB — SURGICAL PCR SCREEN
MRSA, PCR: NEGATIVE
STAPHYLOCOCCUS AUREUS: NEGATIVE

## 2016-07-24 NOTE — H&P (Signed)
TOTAL KNEE ADMISSION H&P  Patient is being admitted for left total knee arthroplasty.  Subjective:  Chief Complaint:left knee pain.  HPI: Gerald Robles, 51 y.o. male, has a history of pain and functional disability in the left knee due to arthritis and has failed non-surgical conservative treatments for greater than 12 weeks to includeNSAID's and/or analgesics, corticosteriod injections, flexibility and strengthening excercises, use of assistive devices, weight reduction as appropriate and activity modification.  Onset of symptoms was gradual, starting >10 years ago with rapidlly worsening course since that time. The patient noted no past surgery on the left knee(s).  Patient currently rates pain in the left knee(s) at 10 out of 10 with activity. Patient has night pain, worsening of pain with activity and weight bearing, pain that interferes with activities of daily living, pain with passive range of motion, crepitus and joint swelling.  Patient has evidence of subchondral cysts, subchondral sclerosis, periarticular osteophytes, joint subluxation, joint space narrowing and significant medial compartment bone loss by imaging studies. There is no active infection.  Patient Active Problem List   Diagnosis Date Noted  . Paroxysmal atrial fibrillation (HCC) 05/04/2015   Past Medical History:  Diagnosis Date  . A-fib (HCC)   . Anemia   . Anemia   . Arthritis   . ED (erectile dysfunction)   . Hypertension   . Sarcoidosis (HCC) 2003    Past Surgical History:  Procedure Laterality Date  . KNEE SURGERY Bilateral 1990   right acl with arthsroscopy; left kee artshroscopy   . SHOULDER SURGERY Right    twice  . VASECTOMY       (Not in a hospital admission) No Known Allergies  Social History  Substance Use Topics  . Smoking status: Never Smoker  . Smokeless tobacco: Never Used  . Alcohol use 24.6 oz/week    41 Cans of beer per week    Family History  Problem Relation Age of Onset  .  Hypertension Mother      Review of Systems  Constitutional: Negative.   HENT: Positive for hearing loss.   Respiratory: Negative.   Cardiovascular: Negative.   Gastrointestinal: Negative.   Genitourinary: Negative.   Musculoskeletal: Positive for back pain, joint pain and myalgias.  Skin: Negative.   Neurological: Negative.   Endo/Heme/Allergies: Negative.   Psychiatric/Behavioral: Negative.     Objective:  Physical Exam  Vitals reviewed. Constitutional: He is oriented to person, place, and time. He appears well-developed and well-nourished.  HENT:  Head: Normocephalic and atraumatic.  Eyes: Conjunctivae and EOM are normal. Pupils are equal, round, and reactive to light.  Neck: Normal range of motion. Neck supple.  Cardiovascular: Normal rate, regular rhythm and intact distal pulses.   Respiratory: Effort normal. No respiratory distress.  GI: Soft. He exhibits no distension.  Genitourinary:  Genitourinary Comments: deferred  Musculoskeletal:       Left knee: He exhibits decreased range of motion, swelling and deformity. Tenderness found. Medial joint line tenderness noted.  Significant varus / valgus instability  Neurological: He is alert and oriented to person, place, and time. He has normal reflexes.  Skin: Skin is warm.  Psychiatric: He has a normal mood and affect. His behavior is normal. Judgment and thought content normal.    Vital signs in last 24 hours: @VSRANGES @  Labs:   Estimated body mass index is 33.4 kg/m as calculated from the following:   Height as of an earlier encounter on 07/24/16: 6\' 3"  (1.905 m).   Weight as of an  earlier encounter on 07/24/16: 121.2 kg (267 lb 3.2 oz).   Imaging Review Plain radiographs demonstrate severe degenerative joint disease of the left knee(s). The overall alignment issignificant varus. The bone quality appears to be adequate for age and reported activity level. There is significant medial compartment bone loss.    Assessment/Plan:  End stage arthritis, left knee   The patient history, physical examination, clinical judgment of the provider and imaging studies are consistent with end stage degenerative joint disease of the left knee(s) and total knee arthroplasty is deemed medically necessary. The treatment options including medical management, injection therapy arthroscopy and arthroplasty were discussed at length. The risks and benefits of total knee arthroplasty were presented and reviewed. The risks due to aseptic loosening, infection, stiffness, patella tracking problems, thromboembolic complications and other imponderables were discussed. The patient acknowledged the explanation, agreed to proceed with the plan and consent was signed. Patient is being admitted for inpatient treatment for surgery, pain control, PT, OT, prophylactic antibiotics, VTE prophylaxis, progressive ambulation and ADL's and discharge planning. The patient is planning to be discharged home with home physical therapy using virtual assistant

## 2016-08-08 MED ORDER — CEFAZOLIN SODIUM 10 G IJ SOLR
3.0000 g | INTRAMUSCULAR | Status: AC
  Administered 2016-08-09: 3 g via INTRAVENOUS
  Filled 2016-08-08: qty 3

## 2016-08-09 ENCOUNTER — Observation Stay (HOSPITAL_COMMUNITY): Payer: BLUE CROSS/BLUE SHIELD

## 2016-08-09 ENCOUNTER — Inpatient Hospital Stay (HOSPITAL_COMMUNITY): Payer: BLUE CROSS/BLUE SHIELD | Admitting: Certified Registered Nurse Anesthetist

## 2016-08-09 ENCOUNTER — Encounter (HOSPITAL_COMMUNITY)
Admission: RE | Disposition: A | Discharge status: Home Health | Payer: Self-pay | Source: Home / Self Care | Attending: Orthopedic Surgery

## 2016-08-09 ENCOUNTER — Inpatient Hospital Stay (HOSPITAL_COMMUNITY)
Admission: RE | Admit: 2016-08-09 | Discharge: 2016-08-11 | DRG: 470 | Disposition: A | Discharge status: Home Health | Payer: BLUE CROSS/BLUE SHIELD | Attending: Orthopedic Surgery | Admitting: Orthopedic Surgery

## 2016-08-09 ENCOUNTER — Encounter (HOSPITAL_COMMUNITY): Payer: Self-pay | Admitting: *Deleted

## 2016-08-09 DIAGNOSIS — Z8249 Family history of ischemic heart disease and other diseases of the circulatory system: Secondary | ICD-10-CM

## 2016-08-09 DIAGNOSIS — E669 Obesity, unspecified: Secondary | ICD-10-CM | POA: Diagnosis present

## 2016-08-09 DIAGNOSIS — I10 Essential (primary) hypertension: Secondary | ICD-10-CM | POA: Diagnosis present

## 2016-08-09 DIAGNOSIS — M25562 Pain in left knee: Secondary | ICD-10-CM | POA: Diagnosis present

## 2016-08-09 DIAGNOSIS — H9190 Unspecified hearing loss, unspecified ear: Secondary | ICD-10-CM | POA: Diagnosis present

## 2016-08-09 DIAGNOSIS — Z7982 Long term (current) use of aspirin: Secondary | ICD-10-CM

## 2016-08-09 DIAGNOSIS — Z6833 Body mass index (BMI) 33.0-33.9, adult: Secondary | ICD-10-CM

## 2016-08-09 DIAGNOSIS — M1732 Unilateral post-traumatic osteoarthritis, left knee: Principal | ICD-10-CM | POA: Diagnosis present

## 2016-08-09 DIAGNOSIS — Z09 Encounter for follow-up examination after completed treatment for conditions other than malignant neoplasm: Secondary | ICD-10-CM

## 2016-08-09 DIAGNOSIS — M1712 Unilateral primary osteoarthritis, left knee: Secondary | ICD-10-CM

## 2016-08-09 DIAGNOSIS — I480 Paroxysmal atrial fibrillation: Secondary | ICD-10-CM | POA: Diagnosis present

## 2016-08-09 HISTORY — PX: KNEE ARTHROPLASTY: SHX992

## 2016-08-09 LAB — TYPE AND SCREEN
ABO/RH(D): O NEG
Antibody Screen: NEGATIVE

## 2016-08-09 LAB — ABO/RH: ABO/RH(D): O NEG

## 2016-08-09 SURGERY — ARTHROPLASTY, KNEE, TOTAL, USING IMAGELESS COMPUTER-ASSISTED NAVIGATION
Anesthesia: Regional | Site: Knee | Laterality: Left

## 2016-08-09 MED ORDER — PHENOL 1.4 % MT LIQD: 1.0000 | OROMUCOSAL | Status: DC | PRN

## 2016-08-09 MED ORDER — DEXAMETHASONE SODIUM PHOSPHATE 10 MG/ML IJ SOLN
10.0000 mg | Freq: Once | INTRAMUSCULAR | Status: AC
  Administered 2016-08-10: 10 mg via INTRAVENOUS
  Filled 2016-08-09: qty 1

## 2016-08-09 MED ORDER — FENTANYL CITRATE (PF) 100 MCG/2ML IJ SOLN
INTRAMUSCULAR | Status: AC
  Filled 2016-08-09: qty 2

## 2016-08-09 MED ORDER — HYDROMORPHONE HCL 1 MG/ML IJ SOLN
0.5000 mg | INTRAMUSCULAR | Status: DC | PRN
  Administered 2016-08-09 – 2016-08-10 (×4): 1 mg via INTRAVENOUS
  Filled 2016-08-09 (×4): qty 1

## 2016-08-09 MED ORDER — ISOPROPYL ALCOHOL 70 % SOLN
Status: DC | PRN
  Administered 2016-08-09: 1 via TOPICAL

## 2016-08-09 MED ORDER — CHLORHEXIDINE GLUCONATE 4 % EX LIQD: 60.0000 mL | Freq: Once | CUTANEOUS | Status: DC

## 2016-08-09 MED ORDER — HYDROMORPHONE HCL 1 MG/ML IJ SOLN
INTRAMUSCULAR | Status: AC
  Filled 2016-08-09: qty 2

## 2016-08-09 MED ORDER — FENTANYL CITRATE (PF) 100 MCG/2ML IJ SOLN
INTRAMUSCULAR | Status: DC | PRN
  Administered 2016-08-09: 50 ug via INTRAVENOUS
  Administered 2016-08-09 (×2): 100 ug via INTRAVENOUS
  Administered 2016-08-09 (×3): 50 ug via INTRAVENOUS

## 2016-08-09 MED ORDER — METOCLOPRAMIDE HCL 5 MG PO TABS: 5.0000 mg | ORAL_TABLET | Freq: Three times a day (TID) | ORAL | Status: DC | PRN

## 2016-08-09 MED ORDER — BUPIVACAINE HCL (PF) 0.25 % IJ SOLN
INTRAMUSCULAR | Status: DC | PRN
  Administered 2016-08-09: 30 mL

## 2016-08-09 MED ORDER — ONDANSETRON HCL 4 MG/2ML IJ SOLN: 4.0000 mg | Freq: Four times a day (QID) | INTRAMUSCULAR | Status: DC | PRN

## 2016-08-09 MED ORDER — ACETAMINOPHEN 650 MG RE SUPP: 650.0000 mg | Freq: Four times a day (QID) | RECTAL | Status: DC | PRN

## 2016-08-09 MED ORDER — PROPOFOL 10 MG/ML IV BOLUS
INTRAVENOUS | Status: AC
  Filled 2016-08-09: qty 20

## 2016-08-09 MED ORDER — SUCCINYLCHOLINE CHLORIDE 200 MG/10ML IV SOSY
PREFILLED_SYRINGE | INTRAVENOUS | Status: DC | PRN
  Administered 2016-08-09: 140 mg via INTRAVENOUS

## 2016-08-09 MED ORDER — SUCCINYLCHOLINE CHLORIDE 200 MG/10ML IV SOSY
PREFILLED_SYRINGE | INTRAVENOUS | Status: AC
  Filled 2016-08-09: qty 10

## 2016-08-09 MED ORDER — SUGAMMADEX SODIUM 200 MG/2ML IV SOLN
INTRAVENOUS | Status: DC | PRN
  Administered 2016-08-09: 250 mg via INTRAVENOUS

## 2016-08-09 MED ORDER — HYDROMORPHONE HCL 1 MG/ML IJ SOLN
INTRAMUSCULAR | Status: DC | PRN
  Administered 2016-08-09 (×2): 1 mg via INTRAVENOUS

## 2016-08-09 MED ORDER — ACETAMINOPHEN 10 MG/ML IV SOLN
1000.0000 mg | INTRAVENOUS | Status: AC
  Administered 2016-08-09: 1000 mg via INTRAVENOUS

## 2016-08-09 MED ORDER — KETOROLAC TROMETHAMINE 15 MG/ML IJ SOLN
15.0000 mg | Freq: Four times a day (QID) | INTRAMUSCULAR | Status: AC
  Administered 2016-08-09 – 2016-08-10 (×4): 15 mg via INTRAVENOUS
  Filled 2016-08-09 (×4): qty 1

## 2016-08-09 MED ORDER — SENNA 8.6 MG PO TABS
2.0000 | ORAL_TABLET | Freq: Every day | ORAL | Status: DC
  Administered 2016-08-09 – 2016-08-10 (×2): 17.2 mg via ORAL
  Filled 2016-08-09 (×3): qty 2

## 2016-08-09 MED ORDER — ONDANSETRON HCL 4 MG/2ML IJ SOLN
INTRAMUSCULAR | Status: DC | PRN
  Administered 2016-08-09: 4 mg via INTRAVENOUS

## 2016-08-09 MED ORDER — SODIUM CHLORIDE 0.9 % IR SOLN
Status: DC | PRN
  Administered 2016-08-09: 3000 mL

## 2016-08-09 MED ORDER — KETOROLAC TROMETHAMINE 30 MG/ML IJ SOLN
INTRAMUSCULAR | Status: AC
  Filled 2016-08-09: qty 1

## 2016-08-09 MED ORDER — ONDANSETRON HCL 4 MG/2ML IJ SOLN
INTRAMUSCULAR | Status: AC
  Filled 2016-08-09: qty 2

## 2016-08-09 MED ORDER — BUPIVACAINE HCL (PF) 0.25 % IJ SOLN
INTRAMUSCULAR | Status: AC
  Filled 2016-08-09: qty 30

## 2016-08-09 MED ORDER — ACETAMINOPHEN 325 MG PO TABS: 650.0000 mg | ORAL_TABLET | Freq: Four times a day (QID) | ORAL | Status: DC | PRN

## 2016-08-09 MED ORDER — SUGAMMADEX SODIUM 500 MG/5ML IV SOLN
INTRAVENOUS | Status: AC
  Filled 2016-08-09: qty 5

## 2016-08-09 MED ORDER — ASPIRIN 81 MG PO CHEW
81.0000 mg | CHEWABLE_TABLET | Freq: Two times a day (BID) | ORAL | Status: DC
  Administered 2016-08-09 – 2016-08-11 (×4): 81 mg via ORAL
  Filled 2016-08-09 (×4): qty 1

## 2016-08-09 MED ORDER — SODIUM CHLORIDE 0.9 % IJ SOLN
INTRAMUSCULAR | Status: AC
  Filled 2016-08-09: qty 50

## 2016-08-09 MED ORDER — SODIUM CHLORIDE 0.9 % IV SOLN
INTRAVENOUS | Status: DC
  Administered 2016-08-09: 19:00:00 via INTRAVENOUS

## 2016-08-09 MED ORDER — HYDROCODONE-ACETAMINOPHEN 5-325 MG PO TABS
1.0000 | ORAL_TABLET | ORAL | Status: DC | PRN
  Administered 2016-08-09: 19:00:00 1 via ORAL
  Administered 2016-08-10 (×2): 2 via ORAL
  Filled 2016-08-09 (×2): qty 2
  Filled 2016-08-09: qty 1

## 2016-08-09 MED ORDER — TRANEXAMIC ACID 1000 MG/10ML IV SOLN
1000.0000 mg | Freq: Once | INTRAVENOUS | Status: AC
  Administered 2016-08-09: 1000 mg via INTRAVENOUS
  Filled 2016-08-09: qty 1100

## 2016-08-09 MED ORDER — SODIUM CHLORIDE 0.9 % IJ SOLN
INTRAMUSCULAR | Status: DC | PRN
  Administered 2016-08-09: 30 mL

## 2016-08-09 MED ORDER — HYDROMORPHONE HCL 1 MG/ML IJ SOLN
INTRAMUSCULAR | Status: AC
  Filled 2016-08-09: qty 1

## 2016-08-09 MED ORDER — DEXAMETHASONE SODIUM PHOSPHATE 10 MG/ML IJ SOLN
INTRAMUSCULAR | Status: AC
  Filled 2016-08-09: qty 1

## 2016-08-09 MED ORDER — ISOPROPYL ALCOHOL 70 % SOLN
Status: AC
  Filled 2016-08-09: qty 480

## 2016-08-09 MED ORDER — LOSARTAN POTASSIUM 25 MG PO TABS
25.0000 mg | ORAL_TABLET | Freq: Every day | ORAL | Status: DC
  Administered 2016-08-10 – 2016-08-11 (×2): 25 mg via ORAL
  Filled 2016-08-09 (×2): qty 1

## 2016-08-09 MED ORDER — HYDROMORPHONE HCL 1 MG/ML IJ SOLN
0.2500 mg | INTRAMUSCULAR | Status: DC | PRN
  Administered 2016-08-09 (×6): 0.5 mg via INTRAVENOUS

## 2016-08-09 MED ORDER — LIDOCAINE 2% (20 MG/ML) 5 ML SYRINGE
INTRAMUSCULAR | Status: DC | PRN
  Administered 2016-08-09: 100 mg via INTRAVENOUS

## 2016-08-09 MED ORDER — MIDAZOLAM HCL 5 MG/5ML IJ SOLN
INTRAMUSCULAR | Status: DC | PRN
  Administered 2016-08-09: 2 mg via INTRAVENOUS

## 2016-08-09 MED ORDER — PROPOFOL 10 MG/ML IV BOLUS
INTRAVENOUS | Status: DC | PRN
Start: 2016-08-09 — End: 2016-08-09
  Administered 2016-08-09: 300 mg via INTRAVENOUS

## 2016-08-09 MED ORDER — KETOROLAC TROMETHAMINE 30 MG/ML IJ SOLN: 30.0000 mg | Freq: Once | INTRAMUSCULAR | Status: DC | PRN

## 2016-08-09 MED ORDER — SODIUM CHLORIDE 0.9 % IV SOLN: INTRAVENOUS | Status: DC

## 2016-08-09 MED ORDER — PROMETHAZINE HCL 25 MG/ML IJ SOLN: 6.2500 mg | INTRAMUSCULAR | Status: DC | PRN

## 2016-08-09 MED ORDER — METHOCARBAMOL 1000 MG/10ML IJ SOLN
500.0000 mg | Freq: Four times a day (QID) | INTRAVENOUS | Status: DC | PRN
  Administered 2016-08-09: 500 mg via INTRAVENOUS
  Filled 2016-08-09: qty 550
  Filled 2016-08-09: qty 5

## 2016-08-09 MED ORDER — ROCURONIUM BROMIDE 50 MG/5ML IV SOSY
PREFILLED_SYRINGE | INTRAVENOUS | Status: AC
  Filled 2016-08-09: qty 5

## 2016-08-09 MED ORDER — KETOROLAC TROMETHAMINE 30 MG/ML IJ SOLN
INTRAMUSCULAR | Status: DC | PRN
  Administered 2016-08-09: 30 mg

## 2016-08-09 MED ORDER — CEFAZOLIN SODIUM-DEXTROSE 2-4 GM/100ML-% IV SOLN
2.0000 g | Freq: Four times a day (QID) | INTRAVENOUS | Status: AC
  Administered 2016-08-09 – 2016-08-10 (×2): 2 g via INTRAVENOUS
  Filled 2016-08-09 (×2): qty 100

## 2016-08-09 MED ORDER — ALUM & MAG HYDROXIDE-SIMETH 200-200-20 MG/5ML PO SUSP: 30.0000 mL | ORAL | Status: DC | PRN

## 2016-08-09 MED ORDER — ACETAMINOPHEN 10 MG/ML IV SOLN
INTRAVENOUS | Status: AC
  Filled 2016-08-09: qty 100

## 2016-08-09 MED ORDER — MENTHOL 3 MG MT LOZG: 1.0000 | LOZENGE | OROMUCOSAL | Status: DC | PRN

## 2016-08-09 MED ORDER — MEPERIDINE HCL 50 MG/ML IJ SOLN: 6.2500 mg | INTRAMUSCULAR | Status: DC | PRN

## 2016-08-09 MED ORDER — PROPOFOL 10 MG/ML IV BOLUS
INTRAVENOUS | Status: AC
  Filled 2016-08-09: qty 60

## 2016-08-09 MED ORDER — SODIUM CHLORIDE 0.9 % IR SOLN
Status: DC | PRN
Start: 2016-08-09 — End: 2016-08-09
  Administered 2016-08-09: 1000 mL

## 2016-08-09 MED ORDER — POVIDONE-IODINE 10 % EX SWAB: 2.0000 "application " | Freq: Once | CUTANEOUS | Status: DC

## 2016-08-09 MED ORDER — METHOCARBAMOL 500 MG PO TABS
500.0000 mg | ORAL_TABLET | Freq: Four times a day (QID) | ORAL | Status: DC | PRN
  Administered 2016-08-10 – 2016-08-11 (×4): 500 mg via ORAL
  Filled 2016-08-09 (×4): qty 1

## 2016-08-09 MED ORDER — DIPHENHYDRAMINE HCL 12.5 MG/5ML PO ELIX
12.5000 mg | ORAL_SOLUTION | ORAL | Status: DC | PRN
  Administered 2016-08-09: 19:00:00 12.5 mg via ORAL
  Filled 2016-08-09: qty 5

## 2016-08-09 MED ORDER — HYDROMORPHONE HCL 2 MG/ML IJ SOLN
INTRAMUSCULAR | Status: AC
  Filled 2016-08-09: qty 1

## 2016-08-09 MED ORDER — ROCURONIUM BROMIDE 50 MG/5ML IV SOSY
PREFILLED_SYRINGE | INTRAVENOUS | Status: DC | PRN
  Administered 2016-08-09: 50 mg via INTRAVENOUS

## 2016-08-09 MED ORDER — METOCLOPRAMIDE HCL 5 MG/ML IJ SOLN
5.0000 mg | Freq: Three times a day (TID) | INTRAMUSCULAR | Status: DC | PRN
Start: 2016-08-09 — End: 2016-08-11

## 2016-08-09 MED ORDER — DOCUSATE SODIUM 100 MG PO CAPS
100.0000 mg | ORAL_CAPSULE | Freq: Two times a day (BID) | ORAL | Status: DC
  Administered 2016-08-09 – 2016-08-11 (×4): 100 mg via ORAL
  Filled 2016-08-09 (×4): qty 1

## 2016-08-09 MED ORDER — PROPOFOL 500 MG/50ML IV EMUL
INTRAVENOUS | Status: DC | PRN
  Administered 2016-08-09: 25 ug/kg/min via INTRAVENOUS

## 2016-08-09 MED ORDER — TRANEXAMIC ACID 1000 MG/10ML IV SOLN
1000.0000 mg | INTRAVENOUS | Status: AC
  Administered 2016-08-09: 1000 mg via INTRAVENOUS
  Filled 2016-08-09: qty 1100

## 2016-08-09 MED ORDER — DEXAMETHASONE SODIUM PHOSPHATE 10 MG/ML IJ SOLN
INTRAMUSCULAR | Status: DC | PRN
  Administered 2016-08-09: 10 mg via INTRAVENOUS

## 2016-08-09 MED ORDER — LIDOCAINE 2% (20 MG/ML) 5 ML SYRINGE
INTRAMUSCULAR | Status: AC
  Filled 2016-08-09: qty 5

## 2016-08-09 MED ORDER — SODIUM CHLORIDE 0.9 % IR SOLN
Status: DC | PRN
  Administered 2016-08-09: 1000 mL

## 2016-08-09 MED ORDER — ONDANSETRON HCL 4 MG PO TABS: 4.0000 mg | ORAL_TABLET | Freq: Four times a day (QID) | ORAL | Status: DC | PRN

## 2016-08-09 MED ORDER — MIDAZOLAM HCL 2 MG/2ML IJ SOLN
INTRAMUSCULAR | Status: AC
  Filled 2016-08-09: qty 2

## 2016-08-09 MED ORDER — LACTATED RINGERS IV SOLN
INTRAVENOUS | Status: DC
  Administered 2016-08-09 (×4): via INTRAVENOUS

## 2016-08-09 SURGICAL SUPPLY — 64 items
BAG ZIPLOCK 12X15 (MISCELLANEOUS) IMPLANT
BANDAGE ACE 4X5 VEL STRL LF (GAUZE/BANDAGES/DRESSINGS) ×2 IMPLANT
BANDAGE ACE 6X5 VEL STRL LF (GAUZE/BANDAGES/DRESSINGS) ×2 IMPLANT
BLADE SAW RECIPROCATING 77.5 (BLADE) ×2 IMPLANT
BONE CEMENT SIMPLEX TOBRAMYCIN (Cement) ×1 IMPLANT
CAPT KNEE TRIATH TK-4 ×2 IMPLANT
CEMENT BONE SIMPLEX TOBRAMYCIN (Cement) ×1 IMPLANT
CHLORAPREP W/TINT 26ML (MISCELLANEOUS) ×4 IMPLANT
COVER SURGICAL LIGHT HANDLE (MISCELLANEOUS) ×2 IMPLANT
CUFF TOURN SGL QUICK 34 (TOURNIQUET CUFF) ×1
CUFF TRNQT CYL 34X4X40X1 (TOURNIQUET CUFF) ×1 IMPLANT
DECANTER SPIKE VIAL GLASS SM (MISCELLANEOUS) ×4 IMPLANT
DERMABOND ADVANCED (GAUZE/BANDAGES/DRESSINGS) ×1
DERMABOND ADVANCED .7 DNX12 (GAUZE/BANDAGES/DRESSINGS) ×1 IMPLANT
DRAPE SHEET LG 3/4 BI-LAMINATE (DRAPES) ×4 IMPLANT
DRAPE U-SHAPE 47X51 STRL (DRAPES) ×2 IMPLANT
DRSG AQUACEL AG ADV 3.5X10 (GAUZE/BANDAGES/DRESSINGS) ×2 IMPLANT
DRSG TEGADERM 4X4.75 (GAUZE/BANDAGES/DRESSINGS) ×2 IMPLANT
ELECT BLADE TIP CTD 4 INCH (ELECTRODE) ×2 IMPLANT
ELECT REM PT RETURN 15FT ADLT (MISCELLANEOUS) ×2 IMPLANT
EVACUATOR 1/8 PVC DRAIN (DRAIN) ×2 IMPLANT
GAUZE SPONGE 4X4 12PLY STRL (GAUZE/BANDAGES/DRESSINGS) ×2 IMPLANT
GLOVE BIO SURGEON STRL SZ8.5 (GLOVE) ×4 IMPLANT
GLOVE BIOGEL PI IND STRL 7.5 (GLOVE) ×6 IMPLANT
GLOVE BIOGEL PI IND STRL 8.5 (GLOVE) ×1 IMPLANT
GLOVE BIOGEL PI INDICATOR 7.5 (GLOVE) ×6
GLOVE BIOGEL PI INDICATOR 8.5 (GLOVE) ×1
GOWN SPEC L3 XXLG W/TWL (GOWN DISPOSABLE) ×2 IMPLANT
GOWN STRL REUS W/ TWL LRG LVL3 (GOWN DISPOSABLE) ×2 IMPLANT
GOWN STRL REUS W/ TWL XL LVL3 (GOWN DISPOSABLE) ×1 IMPLANT
GOWN STRL REUS W/TWL LRG LVL3 (GOWN DISPOSABLE) ×2
GOWN STRL REUS W/TWL XL LVL3 (GOWN DISPOSABLE) ×1
HANDPIECE INTERPULSE COAX TIP (DISPOSABLE) ×1
HOOD PEEL AWAY FLYTE STAYCOOL (MISCELLANEOUS) ×4 IMPLANT
MARKER SKIN DUAL TIP RULER LAB (MISCELLANEOUS) ×2 IMPLANT
NEEDLE SPNL 18GX3.5 QUINCKE PK (NEEDLE) ×2 IMPLANT
NS IRRIG 1000ML POUR BTL (IV SOLUTION) ×2 IMPLANT
PACK TOTAL KNEE CUSTOM (KITS) ×2 IMPLANT
PADDING CAST COTTON 6X4 STRL (CAST SUPPLIES) ×2 IMPLANT
POSITIONER SURGICAL ARM (MISCELLANEOUS) ×2 IMPLANT
SAW OSC TIP CART 19.5X105X1.3 (SAW) ×2 IMPLANT
SCREW CORTEX 3.5 24MM (Screw) ×1 IMPLANT
SCREW LOCK CORT ST 3.5X24 (Screw) ×1 IMPLANT
SEALER BIPOLAR AQUA 6.0 (INSTRUMENTS) ×2 IMPLANT
SET HNDPC FAN SPRY TIP SCT (DISPOSABLE) ×1 IMPLANT
SET PAD KNEE POSITIONER (MISCELLANEOUS) ×2 IMPLANT
SPONGE DRAIN TRACH 4X4 STRL 2S (GAUZE/BANDAGES/DRESSINGS) ×2 IMPLANT
SPONGE LAP 18X18 X RAY DECT (DISPOSABLE) IMPLANT
SUCTION FRAZIER HANDLE 12FR (TUBING) ×1
SUCTION TUBE FRAZIER 12FR DISP (TUBING) ×1 IMPLANT
SUT MNCRL AB 3-0 PS2 18 (SUTURE) ×2 IMPLANT
SUT MON AB 2-0 CT1 36 (SUTURE) ×4 IMPLANT
SUT STRATAFIX PDO 1 14 VIOLET (SUTURE) ×1
SUT STRATFX PDO 1 14 VIOLET (SUTURE) ×1
SUT VIC AB 1 CT1 36 (SUTURE) ×6 IMPLANT
SUT VIC AB 2-0 CT1 27 (SUTURE) ×1
SUT VIC AB 2-0 CT1 TAPERPNT 27 (SUTURE) ×1 IMPLANT
SUTURE STRATFX PDO 1 14 VIOLET (SUTURE) ×1 IMPLANT
SYR 50ML LL SCALE MARK (SYRINGE) ×2 IMPLANT
TOWER CARTRIDGE SMART MIX (DISPOSABLE) IMPLANT
TRAY FOLEY W/METER SILVER 16FR (SET/KITS/TRAYS/PACK) IMPLANT
WATER STERILE IRR 1000ML POUR (IV SOLUTION) ×4 IMPLANT
WRAP KNEE MAXI GEL POST OP (GAUZE/BANDAGES/DRESSINGS) ×2 IMPLANT
YANKAUER SUCT BULB TIP 10FT TU (MISCELLANEOUS) ×2 IMPLANT

## 2016-08-09 NOTE — Transfer of Care (Signed)
Immediate Anesthesia Transfer of Care Note  Patient: Gerald Robles  Procedure(s) Performed: Procedure(s) with comments: LEFT TOTAL KNEE ARTHROPLASTY WITH COMPUTER NAVIGATION (Left) - Needs RNFA  Patient Location: PACU  Anesthesia Type:GA combined with regional for post-op pain  Level of Consciousness:  sedated, patient cooperative and responds to stimulation  Airway & Oxygen Therapy:Patient Spontanous Breathing and Patient connected to face mask oxgen  Post-op Assessment:  Report given to PACU RN and Post -op Vital signs reviewed and stable  Post vital signs:  Reviewed and stable  Last Vitals:  Vitals:   08/09/16 1239 08/09/16 1240  BP: (!) 143/75   Pulse: 75 83  Resp:    Temp:      Complications: No apparent anesthesia complications

## 2016-08-09 NOTE — Anesthesia Preprocedure Evaluation (Addendum)
Anesthesia Evaluation  Patient identified by MRN, date of birth, ID band Patient awake    Reviewed: Allergy & Precautions, NPO status , Patient's Chart, lab work & pertinent test results  Airway Mallampati: I       Dental no notable dental hx.    Pulmonary neg pulmonary ROS,    Pulmonary exam normal        Cardiovascular hypertension, Pt. on medications Normal cardiovascular exam Rhythm:Regular Rate:Normal     Neuro/Psych negative neurological ROS     GI/Hepatic negative GI ROS, Neg liver ROS,   Endo/Other  negative endocrine ROS  Renal/GU negative Renal ROS  negative genitourinary   Musculoskeletal   Abdominal (+) + obese,   Peds  Hematology   Anesthesia Other Findings ECHO COMPLETE WO IMAGE ENHANCING AGENT  Order# [id number]  Reading physician: Gerald Si, MD Ordering physician: Gerald Bathe, MD Study date: 05/20/15 Result Notes   Notes Recorded by Loa Socks, LPN on 0/13/1438 at 9:01 AM Notified the pt that per Dr Anne Fu, his echo showed mildly reduced EF 45-50 %, dilated left atrium (due to a-fib), and he advises that the pt should continue on his current medication regimen, and watch his ETOH intake.  Pt verbalized understanding and request that Xarelto be removed from his medication list, for this med was discontinued by Dr Anne Fu after one month post cardioversion.  Confirmed this med was discontinued by Dr Anne Fu, noted in his last OV with the pt on 05/04/15.  Informed the pt I will d/c this out of his med list.  Pt verbalized understanding and gracious for all the assistance provided. ------  Notes Recorded by Gerald Bathe, MD on 05/23/2015 at 6:52 AM Mildly reduced EF 45-50% Dilated left atrium (AFIB) Continue with current medical mgt. Watch ETOH consumption Gerald Schultz, MD    Study Result   Result status: Final result                          [facility]     [address]                        [place], [place] [address]                            [phone]  ------------------------------------------------------------------- Echocardiography  Patient:    Gerald Robles, Gerald Robles MR #:       [id number] Study Date: 05/20/2015 Gender:     M Age:        49 Height:     190.5 cm Weight:     122.9 kg BSA:        2.59 m^2 Pt. Status: Room:   Althea Grimmer, M.D.  REFERRING    Gerald Robles, M.D.  SONOGRAPHER  Dewitt Hoes, RDCS  PERFORMING   [facility], Outpatient  ATTENDING    Gerald Si, MD  cc:  ------------------------------------------------------------------- LV EF: 45% -   50%  ------------------------------------------------------------------- Indications:      Atrial fibrillation (I48.0).  ------------------------------------------------------------------- History:   PMH:   Atrial fibrillation.  Risk factors:  Obese.  ------------------------------------------------------------------- Study Conclusions  - Left ventricle: The cavity size was normal. There was moderate   concentric hypertrophy. Systolic function was mildly reduced. The   estimated ejection fraction was in the range of 45% to 50%.   Diffuse hypokinesis. Left ventricular diastolic function  parameters were normal. - Left atrium: The atrium was severely dilated. - Right ventricle: The cavity size was mildly dilated. Wall   thickness was normal. Systolic function was normal. - Right atrium: The atrium was mildly dilated. - Inferior vena cava: The vessel was dilated. The respirophasic   diameter changes were blunted (< 50%), consistent with elevated   central venous pressure.  Echocardiography.  M-mode, complete 2D, spectral Doppler, and color Doppler.  Birthdate:  Patient birthdate: [birth year].  Age:  Patient is 51 yr old.  Sex:  Gender: male.    BMI: 33.9 kg/m^2.  Blood pressure:     150/84  Patient status:  Outpatient.  Study date: Study  date: 05/20/2015. Study time: 03:55 PM.  Location:  [facility]  -------------------------------------------------------------------     Reproductive/Obstetrics                             Anesthesia Physical Anesthesia Plan  ASA: II  Anesthesia Plan: General   Post-op Pain Management:  Regional for Post-op pain   Induction: Intravenous  Airway Management Planned: LMA  Additional Equipment:   Intra-op Plan:   Post-operative Plan:   Informed Consent: I have reviewed the patients History and Physical, chart, labs and discussed the procedure including the risks, benefits and alternatives for the proposed anesthesia with the patient or authorized representative who has indicated his/her understanding and acceptance.     Plan Discussed with: CRNA and Surgeon  Anesthesia Plan Comments:        Anesthesia Quick Evaluation

## 2016-08-09 NOTE — Anesthesia Postprocedure Evaluation (Signed)
Anesthesia Post Note  Patient: Dyami Mamone  Procedure(s) Performed: Procedure(s) (LRB): LEFT TOTAL KNEE ARTHROPLASTY WITH COMPUTER NAVIGATION (Left)  Patient location during evaluation: PACU Anesthesia Type: General and Regional Level of consciousness: awake and alert, oriented and patient cooperative Pain management: pain level controlled Vital Signs Assessment: post-procedure vital signs reviewed and stable Respiratory status: spontaneous breathing, nonlabored ventilation, respiratory function stable and patient connected to nasal cannula oxygen Cardiovascular status: blood pressure returned to baseline and stable Postop Assessment: no signs of nausea or vomiting Anesthetic complications: no       Last Vitals:  Vitals:   08/09/16 1845 08/09/16 1949  BP: (!) 143/84 120/60  Pulse: 80 87  Resp: 15 15  Temp: 36.7 C 36.9 C    Last Pain:  Vitals:   08/09/16 1949  TempSrc: Oral  PainSc:                  Revia Nghiem,E. Neeti Knudtson

## 2016-08-09 NOTE — Interval H&P Note (Signed)
History and Physical Interval Note:  08/09/2016 12:24 PM  Gerald Robles  has presented today for surgery, with the diagnosis of Degenerative joint disease left knee  The various methods of treatment have been discussed with the patient and family. After consideration of risks, benefits and other options for treatment, the patient has consented to  Procedure(s) with comments: LEFT TOTAL KNEE ARTHROPLASTY WITH COMPUTER NAVIGATION (Left) - Needs RNFA as a surgical intervention .  The patient's history has been reviewed, patient examined, no change in status, stable for surgery.  I have reviewed the patient's chart and labs.  Questions were answered to the patient's satisfaction.     Keishia Ground, Cloyde Reams

## 2016-08-09 NOTE — Op Note (Signed)
OPERATIVE REPORT  SURGEON: Samson Frederic, MD   ASSISTANT: Staff.  PREOPERATIVE DIAGNOSIS: Left knee arthritis.   POSTOPERATIVE DIAGNOSIS: Left knee arthritis.   PROCEDURE: Left total knee arthroplasty.   IMPLANTS: Stryker Triathlon CR femur, size 7. Stryker universal tibia, size 7. X3 polyethelyene insert, size 9 mm, CR. 3 button asymmetric patella, size 40 mm. Simplex P bone cement.  ANESTHESIA:  General and Regional  TOURNIQUET TIME: 38 min at Hg.  ESTIMATED BLOOD LOSS: 500 mL.  ANTIBIOTICS: 3 g Ancef.  DRAINS: Medium HV x1.  COMPLICATIONS: None   CONDITION: PACU - hemodynamically stable.   BRIEF CLINICAL NOTE: Gerald Robles is a 51 y.o. male with a long-standing history of posttraumatic Left knee arthritis. After failing conservative management, the patient was indicated for total knee arthroplasty. The risks, benefits, and alternatives to the procedure were explained, and the patient elected to proceed.  PROCEDURE IN DETAIL: Adductor canal block was obtained in the pre-op holding area. Once inside the operative room, general anesthesia was induced, and a foley catheter was inserted. The patient was then positioned, a nonsterile tourniquet was placed, and the lower extremity was prepped and draped in the normal sterile surgical fashion. A time-out was called verifying side and site of surgery. The patient received IV antibiotics within 60 minutes of beginning the procedure.   An anterior approach to the knee was performed utilizing a midvastus arthrotomy. A medial release was performed and the patellar fat pad was excised. Stryker navigation was used to cut the distal femur perpendicular to the mechanical axis. A freehand patellar resection was performed, and the patella was sized an prepared with 3 lug holes.  Nagivation was used to make a neutral proximal tibia  resection, taking 8 mm of bone from the less affected lateral side with 3 degrees of slope. The menisci were excised. A spacer block was placed, and the alignment and balance in extension were confirmed.   The distal femur was sized using the 3-degree external rotation guide referencing the posterior femoral cortex. The appropriate 4-in-1 cutting block was pinned into place. Rotation was checked using Whiteside's line, the epicondylar axis, and then confirmed with a spacer block in flexion. The remaining femoral cuts were performed, taking care to protect the MCL.  The tibia was sized and the trial tray was pinned into place. He had a noncontained defect involving about 20% of the most medial aspect of the tibial tray. I drilled and placed a 3.5 x 26 mm cortical screw within the defect. The remaining trail components were inserted. The knee was stable to varus and valgus stress through a full range of motion. The patella tracked centrally, and the PCL was well balanced. The trial components were removed, and the proximal tibial surface was prepared. The extremity was exsanguinated with an Esmarch, and the tourniquet was inflated. Small drill holes were made in the sclerotic subchondral bone.The cut bony surfaces were irrigated with pulse lavage. Final tibial and patellar components were cemented into place and excess cement was cleared. The femoral component was press-fit. The trial insert was placed, and the knee was brought into extension while the cement polymerized. Once the cement was hard, the knee was tested for a final time and found to be well balanced. The trial insert was exchanged for the real polyethylene insert.  The wound was copiously irrigated with normal saline using pulse lavage. Marcaine solution was injected into the periarticular soft tissue. The wound was closed in layers using #1 Vicryl and Stratafix  for the fascia, 2-0 Vicryl for the subcutaneous fat, 2-0 Monocryl for the deep  dermal layer, 3-0 running Monocryl subcuticular Stitch, and Dermabond for the skin. Once the glue was fully dried, an Aquacell Ag and compressive dressing were applied. The tourniquet was let down, and the patient was transported to the recovery room in stable ondition. Sponge, needle, and instrument counts were correct at the end of the case x2. The patient tolerated the procedure well and there were no known complications.

## 2016-08-09 NOTE — Progress Notes (Signed)
Assisted Dr. Hatchett with left, ultrasound guided, adductor canal block. Side rails up, monitors on throughout procedure. See vital signs in flow sheet. Tolerated Procedure well. 

## 2016-08-09 NOTE — Discharge Instructions (Signed)
° °Dr. Tristram Milian °Total Joint Specialist °[facility] °[address] °[place], [place] [address] °[phone] ° °TOTAL KNEE REPLACEMENT POSTOPERATIVE DIRECTIONS ° ° ° °Knee Rehabilitation, Guidelines Following Surgery  °Results after knee surgery are often greatly improved when you follow the exercise, range of motion and muscle strengthening exercises prescribed by your doctor. Safety measures are also important to protect the knee from further injury. Any time any of these exercises cause you to have increased pain or swelling in your knee joint, decrease the amount until you are comfortable again and slowly increase them. If you have problems or questions, call your caregiver or physical therapist for advice.  ° °WEIGHT BEARING °Weight bearing as tolerated with assist device (walker, cane, etc) as directed, use it as long as suggested by your surgeon or therapist, typically at least 4-6 weeks. ° °HOME CARE INSTRUCTIONS  °Remove items at home which could result in a fall. This includes throw rugs or furniture in walking pathways.  °Continue medications as instructed at time of discharge. °You may have some home medications which will be placed on hold until you complete the course of blood thinner medication.  °You may start showering once you are discharged home but do not submerge the incision under water. Just pat the incision dry and apply a dry gauze dressing on daily. °Walk with walker as instructed.  °You may resume a sexual relationship in one month or when given the OK by your doctor.  °· Use walker as long as suggested by your caregivers. °· Avoid periods of inactivity such as sitting longer than an hour when not asleep. This helps prevent blood clots.  °You may put full weight on your legs and walk as much as is comfortable.  °You may return to work once you are cleared by your doctor.  °Do not drive a car for 6 weeks or until released by you surgeon.  °· Do not drive  while taking narcotics.  °Wear the elastic stockings for three weeks following surgery during the day but you may remove then at night. °Make sure you keep all of your appointments after your operation with all of your doctors and caregivers. You should call the office at the above phone number and make an appointment for approximately two weeks after the date of your surgery. °Do not remove your surgical dressing. The dressing is waterproof; you may take showers in 3 days, but do not take tub baths or submerge the dressing. °Please pick up a stool softener and laxative for home use as long as you are requiring pain medications. °· ICE to the affected knee every three hours for 30 minutes at a time and then as needed for pain and swelling.  Continue to use ice on the knee for pain and swelling from surgery. You may notice swelling that will progress down to the foot and ankle.  This is normal after surgery.  Elevate the leg when you are not up walking on it.   °It is important for you to complete the blood thinner medication as prescribed by your doctor. °· Continue to use the breathing machine which will help keep your temperature down.  It is common for your temperature to cycle up and down following surgery, especially at night when you are not up moving around and exerting yourself.  The breathing machine keeps your lungs expanded and your temperature down. ° °RANGE OF MOTION AND STRENGTHENING EXERCISES  °Rehabilitation of the knee is important following   a knee injury or an operation. After just a few days of immobilization, the muscles of the thigh which control the knee become weakened and shrink (atrophy). Knee exercises are designed to build up the tone and strength of the thigh muscles and to improve knee motion. Often times heat used for twenty to thirty minutes before working out will loosen up your tissues and help with improving the range of motion but do not use heat for the first two weeks following  surgery. These exercises can be done on a training (exercise) mat, on the floor, on a table or on a bed. Use what ever works the best and is most comfortable for you Knee exercises include:  °Leg Lifts - While your knee is still immobilized in a splint or cast, you can do straight leg raises. Lift the leg to 60 degrees, hold for 3 sec, and slowly lower the leg. Repeat 10-20 times 2-3 times daily. Perform this exercise against resistance later as your knee gets better.  °Quad and Hamstring Sets - Tighten up the muscle on the front of the thigh (Quad) and hold for 5-10 sec. Repeat this 10-20 times hourly. Hamstring sets are done by pushing the foot backward against an object and holding for 5-10 sec. Repeat as with quad sets.  °A rehabilitation program following serious knee injuries can speed recovery and prevent re-injury in the future due to weakened muscles. Contact your doctor or a physical therapist for more information on knee rehabilitation.  ° °SKILLED REHAB INSTRUCTIONS: °If the patient is transferred to a skilled rehab facility following release from the hospital, a list of the current medications will be sent to the facility for the patient to continue.  When discharged from the skilled rehab facility, please have the facility set up the patient's Home Health Physical Therapy prior to being released. Also, the skilled facility will be responsible for providing the patient with their medications at time of release from the facility to include their pain medication, the muscle relaxants, and their blood thinner medication. If the patient is still at the rehab facility at time of the two week follow up appointment, the skilled rehab facility will also need to assist the patient in arranging follow up appointment in our office and any transportation needs. ° °MAKE SURE YOU:  °Understand these instructions.  °Will watch your condition.  °Will get help right away if you are not doing well or get worse.   ° ° °Pick up stool softner and laxative for home use following surgery while on pain medications. °Do NOT remove your dressing. You may shower.  °Do not take tub baths or submerge incision under water. °May shower starting three days after surgery. °Please use a clean towel to pat the incision dry following showers. °Continue to use ice for pain and swelling after surgery. °Do not use any lotions or creams on the incision until instructed by your surgeon. ° °

## 2016-08-09 NOTE — H&P (View-Only) (Signed)
TOTAL KNEE ADMISSION H&P  Patient is being admitted for left total knee arthroplasty.  Subjective:  Chief Complaint:left knee pain.  HPI: Gerald Robles, 51 y.o. male, has a history of pain and functional disability in the left knee due to arthritis and has failed non-surgical conservative treatments for greater than 12 weeks to includeNSAID's and/or analgesics, corticosteriod injections, flexibility and strengthening excercises, use of assistive devices, weight reduction as appropriate and activity modification.  Onset of symptoms was gradual, starting >10 years ago with rapidlly worsening course since that time. The patient noted no past surgery on the left knee(s).  Patient currently rates pain in the left knee(s) at 10 out of 10 with activity. Patient has night pain, worsening of pain with activity and weight bearing, pain that interferes with activities of daily living, pain with passive range of motion, crepitus and joint swelling.  Patient has evidence of subchondral cysts, subchondral sclerosis, periarticular osteophytes, joint subluxation, joint space narrowing and significant medial compartment bone loss by imaging studies. There is no active infection.  Patient Active Problem List   Diagnosis Date Noted  . Paroxysmal atrial fibrillation (HCC) 05/04/2015   Past Medical History:  Diagnosis Date  . A-fib (HCC)   . Anemia   . Anemia   . Arthritis   . ED (erectile dysfunction)   . Hypertension   . Sarcoidosis (HCC) 2003    Past Surgical History:  Procedure Laterality Date  . KNEE SURGERY Bilateral 1990   right acl with arthsroscopy; left kee artshroscopy   . SHOULDER SURGERY Right    twice  . VASECTOMY       (Not in a hospital admission) No Known Allergies  Social History  Substance Use Topics  . Smoking status: Never Smoker  . Smokeless tobacco: Never Used  . Alcohol use 24.6 oz/week    41 Cans of beer per week    Family History  Problem Relation Age of Onset  .  Hypertension Mother      Review of Systems  Constitutional: Negative.   HENT: Positive for hearing loss.   Respiratory: Negative.   Cardiovascular: Negative.   Gastrointestinal: Negative.   Genitourinary: Negative.   Musculoskeletal: Positive for back pain, joint pain and myalgias.  Skin: Negative.   Neurological: Negative.   Endo/Heme/Allergies: Negative.   Psychiatric/Behavioral: Negative.     Objective:  Physical Exam  Vitals reviewed. Constitutional: He is oriented to person, place, and time. He appears well-developed and well-nourished.  HENT:  Head: Normocephalic and atraumatic.  Eyes: Conjunctivae and EOM are normal. Pupils are equal, round, and reactive to light.  Neck: Normal range of motion. Neck supple.  Cardiovascular: Normal rate, regular rhythm and intact distal pulses.   Respiratory: Effort normal. No respiratory distress.  GI: Soft. He exhibits no distension.  Genitourinary:  Genitourinary Comments: deferred  Musculoskeletal:       Left knee: He exhibits decreased range of motion, swelling and deformity. Tenderness found. Medial joint line tenderness noted.  Significant varus / valgus instability  Neurological: He is alert and oriented to person, place, and time. He has normal reflexes.  Skin: Skin is warm.  Psychiatric: He has a normal mood and affect. His behavior is normal. Judgment and thought content normal.    Vital signs in last 24 hours: @VSRANGES @  Labs:   Estimated body mass index is 33.4 kg/m as calculated from the following:   Height as of an earlier encounter on 07/24/16: 6\' 3"  (1.905 m).   Weight as of an  earlier encounter on 07/24/16: 121.2 kg (267 lb 3.2 oz).   Imaging Review Plain radiographs demonstrate severe degenerative joint disease of the left knee(s). The overall alignment issignificant varus. The bone quality appears to be adequate for age and reported activity level. There is significant medial compartment bone loss.    Assessment/Plan:  End stage arthritis, left knee   The patient history, physical examination, clinical judgment of the provider and imaging studies are consistent with end stage degenerative joint disease of the left knee(s) and total knee arthroplasty is deemed medically necessary. The treatment options including medical management, injection therapy arthroscopy and arthroplasty were discussed at length. The risks and benefits of total knee arthroplasty were presented and reviewed. The risks due to aseptic loosening, infection, stiffness, patella tracking problems, thromboembolic complications and other imponderables were discussed. The patient acknowledged the explanation, agreed to proceed with the plan and consent was signed. Patient is being admitted for inpatient treatment for surgery, pain control, PT, OT, prophylactic antibiotics, VTE prophylaxis, progressive ambulation and ADL's and discharge planning. The patient is planning to be discharged home with home physical therapy using virtual assistant

## 2016-08-09 NOTE — Anesthesia Procedure Notes (Addendum)
Anesthesia Regional Block: Adductor canal block   Pre-Anesthetic Checklist: ,, timeout performed, Correct Patient, Correct Site, Correct Laterality, Correct Procedure, Correct Position, site marked, Risks and benefits discussed,  Surgical consent,  Pre-op evaluation,  At surgeon's request and post-op pain management  Laterality: Left and Upper  Prep: chloraprep       Needles:  Injection technique: Single-shot  Needle Type: Echogenic Stimulator Needle     Needle Length: 9cm  Needle Gauge: 21   Needle insertion depth: 5 cm   Additional Needles:   Procedures: ultrasound guided,,,,,,,,  Narrative:  Start time: 08/09/2016 12:20 PM End time: 08/09/2016 12:34 PM Injection made incrementally with aspirations every 5 mL.  Performed by: Personally  Anesthesiologist: Leilani Able

## 2016-08-09 NOTE — Anesthesia Procedure Notes (Signed)
Procedure Name: Intubation Date/Time: 08/09/2016 12:56 PM Performed by: Montel Clock Pre-anesthesia Checklist: Patient identified, Emergency Drugs available, Suction available, Patient being monitored and Timeout performed Patient Re-evaluated:Patient Re-evaluated prior to inductionOxygen Delivery Method: Circle system utilized Preoxygenation: Pre-oxygenation with 100% oxygen Intubation Type: IV induction Ventilation: Mask ventilation without difficulty and Oral airway inserted - appropriate to patient size Laryngoscope Size: Mac and 3 Grade View: Grade I Tube type: Oral Tube size: 7.5 mm Number of attempts: 1 Airway Equipment and Method: Stylet Placement Confirmation: ETT inserted through vocal cords under direct vision,  positive ETCO2 and breath sounds checked- equal and bilateral Secured at: 23 cm Tube secured with: Tape Dental Injury: Teeth and Oropharynx as per pre-operative assessment

## 2016-08-10 ENCOUNTER — Encounter (HOSPITAL_COMMUNITY): Payer: Self-pay | Admitting: Orthopedic Surgery

## 2016-08-10 LAB — BASIC METABOLIC PANEL
Anion gap: 6 (ref 5–15)
BUN: 7 mg/dL (ref 6–20)
CALCIUM: 8.3 mg/dL — AB (ref 8.9–10.3)
CO2: 26 mmol/L (ref 22–32)
CREATININE: 0.69 mg/dL (ref 0.61–1.24)
Chloride: 103 mmol/L (ref 101–111)
GFR calc non Af Amer: >60 mL/min (ref >60)
GLUCOSE: 161 mg/dL — AB (ref 65–99)
Potassium: 3.7 mmol/L (ref 3.5–5.1)
Sodium: 135 mmol/L (ref 135–145)

## 2016-08-10 LAB — CBC
HEMATOCRIT: 27.8 % — AB (ref 39.0–52.0)
Hemoglobin: 9.3 g/dL — ABNORMAL LOW (ref 13.0–17.0)
MCH: 29.4 pg (ref 26.0–34.0)
MCHC: 33.5 g/dL (ref 30.0–36.0)
MCV: 88 fL (ref 78.0–100.0)
Platelets: 240 10*3/uL (ref 150–400)
RBC: 3.16 MIL/uL — ABNORMAL LOW (ref 4.22–5.81)
RDW: 12.3 % (ref 11.5–15.5)
WBC: 7.2 10*3/uL (ref 4.0–10.5)

## 2016-08-10 MED ORDER — OXYCODONE HCL ER 10 MG PO T12A: 10.0000 mg | EXTENDED_RELEASE_TABLET | ORAL | 0 refills | Status: DC

## 2016-08-10 MED ORDER — ONDANSETRON HCL 4 MG PO TABS: 4.0000 mg | ORAL_TABLET | Freq: Four times a day (QID) | ORAL | 0 refills | Status: DC | PRN

## 2016-08-10 MED ORDER — SENNA 8.6 MG PO TABS: 2.0000 | ORAL_TABLET | Freq: Every day | ORAL | 0 refills | Status: DC

## 2016-08-10 MED ORDER — OXYCODONE HCL ER 10 MG PO T12A
10.0000 mg | EXTENDED_RELEASE_TABLET | Freq: Two times a day (BID) | ORAL | Status: DC
  Administered 2016-08-10 – 2016-08-11 (×3): 10 mg via ORAL
  Filled 2016-08-10 (×3): qty 1

## 2016-08-10 MED ORDER — OXYCODONE-ACETAMINOPHEN 5-325 MG PO TABS
1.0000 | ORAL_TABLET | ORAL | Status: DC | PRN
  Administered 2016-08-10 – 2016-08-11 (×6): 2 via ORAL
  Filled 2016-08-10 (×6): qty 2

## 2016-08-10 MED ORDER — DOCUSATE SODIUM 100 MG PO CAPS: 100.0000 mg | ORAL_CAPSULE | Freq: Two times a day (BID) | ORAL | 1 refills | Status: DC

## 2016-08-10 MED ORDER — ASPIRIN 81 MG PO CHEW: 81.0000 mg | CHEWABLE_TABLET | Freq: Two times a day (BID) | ORAL | 1 refills | Status: DC

## 2016-08-10 MED ORDER — OXYCODONE-ACETAMINOPHEN 5-325 MG PO TABS: 1.0000 | ORAL_TABLET | ORAL | 0 refills | Status: DC | PRN

## 2016-08-10 NOTE — Evaluation (Signed)
Occupational Therapy Evaluation Patient Details Name: Gerald Robles MRN: [id number] DOB: 1965/11/14 Today's Date: 08/10/2016    History of Present Illness s/p L TKA   Clinical Impression   This 51 year old man was admitted for the above sx. He will benefit from one more session of OT to increase safety and independence with adls. Pt is overall supervision/set up and min A for R ted hose.  Goals in acute are for mod I level as pt will be home alone with intermittent assistance from friends    Follow Up Recommendations  No OT follow up    Equipment Recommendations  None recommended by OT    Recommendations for Other Services       Precautions / Restrictions Precautions Precautions: Knee Restrictions Weight Bearing Restrictions: No Other Position/Activity Restrictions: WBAT      Mobility Bed Mobility               General bed mobility comments: OOB  Transfers Overall transfer level: Needs assistance Equipment used: Rolling walker (2 wheeled) Transfers: Sit to/from Stand Sit to Stand: Supervision         General transfer comment: cues for UE/LE placement    Balance                                           ADL either performed or assessed with clinical judgement   ADL Overall ADL's : Needs assistance/impaired Eating/Feeding: Independent   Grooming: Supervision/safety;Standing   Upper Body Bathing: Set up;Sitting   Lower Body Bathing: Supervison/ safety;Sit to/from stand   Upper Body Dressing : Set up;Sitting   Lower Body Dressing: Set up;Sit to/from stand;Minimal assistance Lower Body Dressing Details (indicate cue type and reason): assist only for L ted hose Toilet Transfer: Supervision/safety;RW;Ambulation Statistician Details (indicate cue type and reason): chair Toileting- Clothing Manipulation and Hygiene: Supervision/safety;Sit to/from stand   Tub/ Shower Transfer: Walk-in shower;Supervision/safety;Ambulation      General ADL Comments: pt donned underwear and L ted hose/sock.  He will have intermittent assistance at home. Practiced shower transfer and reviewed precautions     Vision         Perception     Praxis      Pertinent Vitals/Pain Pain Assessment: 0-10 Pain Score: 4  Pain Location: L knee Pain Descriptors / Indicators: Sore Pain Intervention(s): Limited activity within patient's tolerance;Monitored during session;Premedicated before session;Repositioned;Ice applied     Hand Dominance     Extremity/Trunk Assessment Upper Extremity Assessment Upper Extremity Assessment: Overall WFL for tasks assessed           Communication Communication Communication: No difficulties   Cognition Arousal/Alertness: Awake/alert Behavior During Therapy: WFL for tasks assessed/performed Overall Cognitive Status: Within Functional Limits for tasks assessed                                     General Comments       Exercises     Shoulder Instructions      Home Living Family/patient expects to be discharged to:: Private residence Living Arrangements: Alone Available Help at Discharge: Friend(s);Available PRN/intermittently               Bathroom Shower/Tub: Walk-in shower   Bathroom Toilet: Handicapped height         Additional Comments: pt is borrowing  a 3:1 commode      Prior Functioning/Environment Level of Independence: Independent                 OT Problem List: Decreased strength;Pain      OT Treatment/Interventions: Self-care/ADL training;DME and/or AE instruction;Patient/family education    OT Goals(Current goals can be found in the care plan section) Acute Rehab OT Goals Patient Stated Goal: get life back and do everything I want OT Goal Formulation: With patient Time For Goal Achievement: 08/12/16 Potential to Achieve Goals: Good ADL Goals Pt Will Transfer to Toilet: with modified independence;ambulating;bedside  commode Additional ADL Goal #1: pt will gather clothes at mod I level and complete ADL  OT Frequency: Min 2X/week   Barriers to D/C:            Co-evaluation              End of Session    Activity Tolerance: Patient tolerated treatment well Patient left: in chair;with call bell/phone within reach  OT Visit Diagnosis: Pain Pain - Right/Left: Left Pain - part of body: Knee                Time: 1610-9604 OT Time Calculation (min): 25 min Charges:  OT General Charges $OT Visit: 1 Procedure OT Evaluation $OT Eval Low Complexity: 1 Procedure G-Codes: OT G-codes **NOT FOR INPATIENT CLASS** Functional Assessment Tool Used: AM-PAC 6 Clicks Daily Activity;Clinical judgement Functional Limitation: Self care Self Care Current Status (V4098): At least 1 percent but less than 20 percent impaired, limited or restricted Self Care Goal Status (J1914): 0 percent impaired, limited or restricted   Marica Otter, OTR/L [phone] 08/10/2016  Amandalee Lacap 08/10/2016, 10:05 AM

## 2016-08-10 NOTE — Care Management Note (Signed)
Case Management Note  Patient Details  Name: Gerald Robles MRN: [id number] Date of Birth: [birth year]  Subjective/Objective:     51 yo admitted for L TKA               Action/Plan: From home alone. Pt states he has a RW and is borrowing a bedside commode from a friend. Pt states he is planning on doing virtual PT at home.  Expected Discharge Date:  08/11/16               Expected Discharge Plan:  Home/Self Care  In-House Referral:     Discharge planning Services  CM Consult  Post Acute Care Choice:    Choice offered to:     DME Arranged:    DME Agency:     HH Arranged:    HH Agency:     Status of Service:  Completed, signed off  If discussed at [employer] of Stay Meetings, dates discussed:    Additional CommentsBartholome Bill, RN 08/10/2016, 1:08 PM  [phone]

## 2016-08-10 NOTE — Evaluation (Signed)
Physical Therapy Evaluation Patient Details Name: Gerald Robles MRN: [id number] DOB: [birth year] Today's Date: 08/10/2016   History of Present Illness  s/p L TKA  Clinical Impression  Pt s/p L TKR and presents with decreased L LE strength/ROM and post op pain limiting functional mobility.  Pt should progress to dc home with PRN assist of family/friends.    Follow Up Recommendations Other (comment) (Pt plans virtual PT)    Equipment Recommendations  None recommended by PT    Recommendations for Other Services OT consult     Precautions / Restrictions Precautions Precautions: Knee Restrictions Weight Bearing Restrictions: No Other Position/Activity Restrictions: WBAT      Mobility  Bed Mobility Overal bed mobility: Needs Assistance Bed Mobility: Supine to Sit     Supine to sit: Supervision     General bed mobility comments: min cues for sequence and use of   Transfers Overall transfer level: Needs assistance Equipment used: Rolling walker (2 wheeled) Transfers: Sit to/from Stand Sit to Stand: Min guard         General transfer comment: cues for UE/LE placement  Ambulation/Gait Ambulation/Gait assistance: Min assist;Min guard Ambulation Distance (Feet): 120 Feet Assistive device: Rolling walker (2 wheeled) Gait Pattern/deviations: Step-to pattern;Step-through pattern;Decreased step length - right;Decreased step length - left;Shuffle;Trunk flexed Gait velocity: decr   General Gait Details: cues for posture, position from RW and initial sequence  Stairs            Wheelchair Mobility    Modified Rankin (Stroke Patients Only)       Balance                                             Pertinent Vitals/Pain Pain Assessment: 0-10 Pain Score: 4  Pain Location: L knee Pain Descriptors / Indicators: Sore Pain Intervention(s): Limited activity within patient's tolerance;Monitored during session;Premedicated before session;Ice applied     Home Living Family/patient expects to be discharged to:: Private residence Living Arrangements: Alone Available Help at Discharge: Friend(s);Available PRN/intermittently Type of Home: House Home Access: Stairs to enter Entrance Stairs-Rails: Right Entrance Stairs-Number of Steps: 4 Home Layout: One level Home Equipment: Walker - 2 wheels;Walker - standard;Cane - single point      Prior Function Level of Independence: Independent               Hand Dominance        Extremity/Trunk Assessment   Upper Extremity Assessment Upper Extremity Assessment: Overall WFL for tasks assessed    Lower Extremity Assessment Lower Extremity Assessment: LLE deficits/detail LLE Deficits / Details: 3/5 quads with IND SLR and AAROM at knee -10 - 80    Cervical / Trunk Assessment Cervical / Trunk Assessment: Normal  Communication   Communication: No difficulties  Cognition Arousal/Alertness: Awake/alert Behavior During Therapy: WFL for tasks assessed/performed Overall Cognitive Status: Within Functional Limits for tasks assessed                                        General Comments      Exercises Total Joint Exercises Ankle Circles/Pumps: AROM;Both;Supine;20 reps Quad Sets: AROM;Both;10 reps;Supine Heel Slides: AAROM;Left;15 reps;Supine Straight Leg Raises: AAROM;AROM;Left;15 reps;Supine   Assessment/Plan    PT Assessment Patient needs continued PT services  PT Problem List Decreased strength;Decreased range of  motion;Decreased activity tolerance;Decreased mobility;Decreased knowledge of use of DME;Pain       PT Treatment Interventions DME instruction;Gait training;Stair training;Functional mobility training;Therapeutic activities;Therapeutic exercise;Patient/family education    PT Goals (Current goals can be found in the Care Plan section)  Acute Rehab PT Goals Patient Stated Goal: get life back and do everything I want PT Goal Formulation: With  patient Time For Goal Achievement: 08/13/16 Potential to Achieve Goals: Good    Frequency 7X/week   Barriers to discharge        Co-evaluation               End of Session Equipment Utilized During Treatment: Gait belt Activity Tolerance: Patient tolerated treatment well Patient left: in chair;with call bell/phone within reach Nurse Communication: Mobility status PT Visit Diagnosis: Unsteadiness on feet (R26.81);Difficulty in walking, not elsewhere classified (R26.2)    Time: 4098-1191 PT Time Calculation (min) (ACUTE ONLY): 42 min   Charges:   PT Evaluation $PT Eval Low Complexity: 1 Procedure PT Treatments $Gait Training: 8-22 mins $Therapeutic Exercise: 8-22 mins   PT G Codes:         Gerald Robles 08/10/2016, 2:33 PM

## 2016-08-10 NOTE — Discharge Summary (Signed)
Physician Discharge Summary  Patient ID: Gerald Robles MRN: [id number] DOB/AGE: Apr 03, 1966 51 y.o.  Admit date: 08/09/2016 Discharge date: 08/11/2016  Admission Diagnoses:  Osteoarthritis of left knee  Discharge Diagnoses:  Principal Problem:   Osteoarthritis of left knee   Past Medical History:  Diagnosis Date  . A-fib (HCC)   . Anemia   . Anemia   . Arthritis   . ED (erectile dysfunction)   . Hypertension   . Sarcoidosis (HCC) 2003    Surgeries: Procedure(s): LEFT TOTAL KNEE ARTHROPLASTY WITH COMPUTER NAVIGATION on 08/09/2016   Consultants (if any):   Discharged Condition: Improved  Hospital Course: Dillon Livermore is an 51 y.o. male who was admitted 08/09/2016 with a diagnosis of Osteoarthritis of left knee and went to the operating room on 08/09/2016 and underwent the above named procedures.    He was given perioperative antibiotics:  Anti-infectives    Start     Dose/Rate Route Frequency Ordered Stop   08/09/16 2000  ceFAZolin (ANCEF) IVPB 2g/100 mL premix     2 g 200 mL/hr over 30 Minutes Intravenous Every 6 hours 08/09/16 1751 08/10/16 0246   08/09/16 0600  ceFAZolin (ANCEF) 3 g in dextrose 5 % 50 mL IVPB     3 g 130 mL/hr over 30 Minutes Intravenous On call to O.R. 08/08/16 1251 08/09/16 1258    .  He was given sequential compression devices, early ambulation, and ASA for DVT prophylaxis.  He benefited maximally from the hospital stay and there were no complications.    Recent vital signs:  Vitals:   08/10/16 2054 08/11/16 0600  BP: (!) 143/73 (!) 143/79  Pulse: 74 68  Resp: 16 18  Temp: 98 F (36.7 C) 98.1 F (36.7 C)    Recent laboratory studies:  Lab Results  Component Value Date   HGB 9.1 (L) 08/11/2016   HGB 9.3 (L) 08/10/2016   HGB 13.0 07/24/2016   Lab Results  Component Value Date   WBC 7.8 08/11/2016   PLT 232 08/11/2016   No results found for: INR Lab Results  Component Value Date   NA 135 08/10/2016   K 3.7 08/10/2016   CL 103  08/10/2016   CO2 26 08/10/2016   BUN 7 08/10/2016   CREATININE 0.69 08/10/2016   GLUCOSE 161 (H) 08/10/2016    Discharge Medications:   Allergies as of 08/11/2016   No Known Allergies     Medication List    STOP taking these medications   acetaminophen 500 MG tablet Commonly known as:  TYLENOL     TAKE these medications   aspirin 81 MG chewable tablet Chew 1 tablet (81 mg total) by mouth 2 (two) times daily.   diclofenac 75 MG EC tablet Commonly known as:  VOLTAREN Take 75 mg by mouth 2 (two) times daily.   docusate sodium 100 MG capsule Commonly known as:  COLACE Take 1 capsule (100 mg total) by mouth 2 (two) times daily.   losartan 25 MG tablet Commonly known as:  COZAAR Take 25 mg by mouth daily.   ondansetron 4 MG tablet Commonly known as:  ZOFRAN Take 1 tablet (4 mg total) by mouth every 6 (six) hours as needed for nausea.   oxyCODONE 10 mg 12 hr tablet Commonly known as:  OXYCONTIN Take 1 tablet (10 mg total) by mouth PRO. 1 tab PO every 12 hours for 3 days, then 1 tab PO daily for 4 days   oxyCODONE-acetaminophen 5-325 MG tablet Commonly known as:  PERCOCET/ROXICET Take 1-2 tablets by mouth every 4 (four) hours as needed for moderate pain.   senna 8.6 MG Tabs tablet Commonly known as:  SENOKOT Take 2 tablets (17.2 mg total) by mouth at bedtime.       Diagnostic Studies: Dg Knee Left Port  Result Date: 08/09/2016 CLINICAL DATA:  LEFT knee arthroplasty today. EXAM: PORTABLE LEFT KNEE - 1-2 VIEW COMPARISON:  None. FINDINGS: Status post total knee arthroplasty with intact well seated femoral and tibial components. Expected appearance of the resurfaced patella. No acute fracture deformity. No dislocation. No destructive bony lesions. Chronic fragmentation tibial tuberosity.Surgical drain in place with subcutaneous gas. IMPRESSION: Status post total knee arthroplasty with expected postoperative changes, surgical drain in place. Electronically Signed   By:  Awilda Metro M.D.   On: 08/09/2016 19:26    Disposition: 01-Home or Self Care  Discharge Instructions    Call MD / Call 911    Complete by:  As directed    If you experience chest pain or shortness of breath, CALL 911 and be transported to the hospital emergency room.  If you develope a fever above 101 F, pus (white drainage) or increased drainage or redness at the wound, or calf pain, call your surgeon's office.   Call MD / Call 911    Complete by:  As directed    If you experience chest pain or shortness of breath, CALL 911 and be transported to the hospital emergency room.  If you develope a fever above 101 F, pus (white drainage) or increased drainage or redness at the wound, or calf pain, call your surgeon's office.   Constipation Prevention    Complete by:  As directed    Drink plenty of fluids.  Prune juice may be helpful.  You may use a stool softener, such as Colace (over the counter) 100 mg twice a day.  Use MiraLax (over the counter) for constipation as needed.   Constipation Prevention    Complete by:  As directed    Drink plenty of fluids.  Prune juice may be helpful.  You may use a stool softener, such as Colace (over the counter) 100 mg twice a day.  Use MiraLax (over the counter) for constipation as needed.   Diet - low sodium heart healthy    Complete by:  As directed    Diet - low sodium heart healthy    Complete by:  As directed    Do not put a pillow under the knee. Place it under the heel.    Complete by:  As directed    Driving restrictions    Complete by:  As directed    No driving for 6 weeks   Increase activity slowly as tolerated    Complete by:  As directed    Increase activity slowly as tolerated    Complete by:  As directed    Lifting restrictions    Complete by:  As directed    No lifting for 6 weeks   TED hose    Complete by:  As directed    Use stockings (TED hose) for 2 weeks on both leg(s).  You may remove them at night for sleeping.       Follow-up Information    Graycee Greeson, Cloyde Reams, MD. Schedule an appointment as soon as possible for a visit in 2 week(s).   Specialty:  Orthopedic Surgery Why:  For wound re-check Contact information: [address] [place] [address] [phone]  Signed: Garnet Koyanagi 08/12/2016, 2:17 PM

## 2016-08-10 NOTE — Progress Notes (Signed)
qPhysical Therapy Treatment Patient Details Name: Gerald Robles MRN: [id number] DOB: May 27, 1965 Today's Date: 08/10/2016    History of Present Illness s/p L TKA    PT Comments    Pt motivated, progressing well with mobility and hopeful for dc home tomorrow.   Follow Up Recommendations  Other (comment)     Equipment Recommendations  None recommended by PT    Recommendations for Other Services OT consult     Precautions / Restrictions Precautions Precautions: Knee Restrictions Weight Bearing Restrictions: No Other Position/Activity Restrictions: WBAT    Mobility  Bed Mobility Overal bed mobility: Needs Assistance Bed Mobility: Supine to Sit;Sit to Supine     Supine to sit: Supervision Sit to supine: Supervision   General bed mobility comments: min cues for sequence and use of   Transfers Overall transfer level: Needs assistance Equipment used: Rolling walker (2 wheeled) Transfers: Sit to/from Stand Sit to Stand: Min guard;Supervision         General transfer comment: cues for UE/LE placement  Ambulation/Gait Ambulation/Gait assistance: Min guard;Supervision Ambulation Distance (Feet): 200 Feet Assistive device: Rolling walker (2 wheeled) Gait Pattern/deviations: Step-to pattern;Step-through pattern;Decreased step length - right;Decreased step length - left;Shuffle Gait velocity: decr Gait velocity interpretation: Below normal speed for age/gender General Gait Details: cues for posture, position from RW and initial sequence   Stairs            Wheelchair Mobility    Modified Rankin (Stroke Patients Only)       Balance                                            Cognition Arousal/Alertness: Awake/alert Behavior During Therapy: WFL for tasks assessed/performed Overall Cognitive Status: Within Functional Limits for tasks assessed                                        Exercises Total Joint Exercises Ankle  Circles/Pumps: AROM;Both;Supine;20 reps Quad Sets: AROM;Both;10 reps;Supine Heel Slides: AAROM;Left;15 reps;Supine Straight Leg Raises: AAROM;AROM;Left;15 reps;Supine    General Comments        Pertinent Vitals/Pain Pain Assessment: 0-10 Pain Score: 4  Pain Location: L knee Pain Descriptors / Indicators: Sore Pain Intervention(s): Limited activity within patient's tolerance;Monitored during session;Premedicated before session;Ice applied    Home Living Family/patient expects to be discharged to:: Private residence Living Arrangements: Alone Available Help at Discharge: Friend(s);Available PRN/intermittently Type of Home: House Home Access: Stairs to enter Entrance Stairs-Rails: Right Home Layout: One level Home Equipment: Walker - 2 wheels;Walker - standard;Cane - single point      Prior Function Level of Independence: Independent          PT Goals (current goals can now be found in the care plan section) Acute Rehab PT Goals Patient Stated Goal: get life back and do everything I want PT Goal Formulation: With patient Time For Goal Achievement: 08/13/16 Potential to Achieve Goals: Good    Frequency    7X/week      PT Plan Current plan remains appropriate    Co-evaluation             End of Session Equipment Utilized During Treatment: Gait belt Activity Tolerance: Patient tolerated treatment well Patient left: with call bell/phone within reach;in bed Nurse Communication: Mobility status PT Visit Diagnosis:  Unsteadiness on feet (R26.81);Difficulty in walking, not elsewhere classified (R26.2)     Time: 1340-1415 PT Time Calculation (min) (ACUTE ONLY): 35 min  Charges:  $Gait Training: 8-22 mins $Therapeutic Exercise: 8-22 mins                    G Codes:       Gerald Robles Gerald Robles 11, 2018, 2:39 PM

## 2016-08-10 NOTE — Progress Notes (Signed)
Pt transferring to 5W, room 1535. Report given to Sheppard Penton, RN. Patient understands the reasons for transferring and has no questions or concerns.

## 2016-08-10 NOTE — Progress Notes (Signed)
   08/10/16 1400  PT Time Calculation  PT Start Time (ACUTE ONLY) 0845  PT Stop Time (ACUTE ONLY) 0927  PT Time Calculation (min) (ACUTE ONLY) 42 min  PT G-Codes **NOT FOR INPATIENT CLASS**  Functional Assessment Tool Used Clinical judgement  Functional Limitation Mobility: Walking and moving around  Mobility: Walking and Moving Around Current Status (J6811) CJ  Mobility: Walking and Moving Around Goal Status (X7262) CI  PT General Charges  $$ ACUTE PT VISIT 1 Procedure  PT Evaluation  $PT Eval Low Complexity 1 Procedure  PT Treatments  $Gait Training 8-22 mins  $Therapeutic Exercise 8-22 mins

## 2016-08-10 NOTE — Progress Notes (Signed)
   Subjective:  Patient reports pain as moderate to severe.  Pain control issues o/n. Denies N/V/CP/SOB.  Objective:   VITALS:   Vitals:   08/09/16 1949 08/09/16 2103 08/10/16 0106 08/10/16 0535  BP: 120/60 130/68 123/64 124/69  Pulse: 87 81 79 68  Resp: 15 15 15 15   Temp: 98.4 F (36.9 C) 97.7 F (36.5 C) 98.3 F (36.8 C) 97.8 F (36.6 C)  TempSrc: Oral Oral Oral Oral  SpO2: 98% 99% 97% 99%  Weight:      Height:        NAD ABD soft Sensation intact distally Intact pulses distally Dorsiflexion/Plantar flexion intact Incision: dressing C/D/I Compartment soft HV ss   Lab Results  Component Value Date   WBC 7.2 08/10/2016   HGB 9.3 (L) 08/10/2016   HCT 27.8 (L) 08/10/2016   MCV 88.0 08/10/2016   PLT 240 08/10/2016   BMET    Component Value Date/Time   NA 135 08/10/2016 0450   K 3.7 08/10/2016 0450   CL 103 08/10/2016 0450   CO2 26 08/10/2016 0450   GLUCOSE 161 (H) 08/10/2016 0450   BUN 7 08/10/2016 0450   CREATININE 0.69 08/10/2016 0450   CALCIUM 8.3 (L) 08/10/2016 0450   GFRNONAA >60 08/10/2016 0450   GFRAA >60 08/10/2016 0450     Assessment/Plan: 1 Day Post-Op   Principal Problem:   Osteoarthritis of left knee   WBAT with walker DVT ppx: ASA, SCDs, TEDs PT/OT PO pain control: will change Norco to percocet, add low dose oxycontin PT/OT D/C HV drain Dispo: D/C home tomorrow with outpatient PT   Darinda [place], Cloyde Reams 08/10/2016, 8:36 AM   Samson Frederic, MD Cell [phone]

## 2016-08-11 LAB — CBC
HCT: 27 % — ABNORMAL LOW (ref 39.0–52.0)
Hemoglobin: 9.1 g/dL — ABNORMAL LOW (ref 13.0–17.0)
MCH: 30.7 pg (ref 26.0–34.0)
MCHC: 33.7 g/dL (ref 30.0–36.0)
MCV: 91.2 fL (ref 78.0–100.0)
PLATELETS: 232 10*3/uL (ref 150–400)
RBC: 2.96 MIL/uL — ABNORMAL LOW (ref 4.22–5.81)
RDW: 12.5 % (ref 11.5–15.5)
WBC: 7.8 10*3/uL (ref 4.0–10.5)

## 2016-08-11 NOTE — Care Management Note (Addendum)
Received call from RN stating that pt is going home and he needs HHPT.  Met with pt. He plans to return home with the support of his friends. Provided pt with a list of [place] agencies. He chose [facility]. Contacted Butch Penny at [place] at [facility] for referral.

## 2016-08-11 NOTE — Discharge Summary (Signed)
Physician Discharge Summary  Patient ID: Gerald Robles MRN: [id number] DOB/AGE: 1965/08/02 51 y.o.  Admit date: 08/09/2016 Discharge date: 08/11/2016  Admission Diagnoses: Osteoarthritis L knee; paroxysmal A-fib  Discharge Diagnoses:  Principal Problem:   Osteoarthritis of left knee same as above  Discharged Condition: stable  Hospital Course: Patient reported to [facility] ED on 08/09/2016 for elective L TKA by Dr. Linna Caprice.  The patient tolerated the procedure well without complication.  The patient was then admitted to the hospital.  He worked well with therapy.  No complications during his hospital course.  He will be D/C'd home on 08/11/2016.  Consults: PT  Significant Diagnostic Studies: N/A  Treatments: IV hydration, antibiotics: Ancef, analgesia: acetaminophen, acetaminophen w/ codeine, Vicodin and Dilaudid, cardiac meds: losartan, anticoagulation: ASA and surgery: as stated above.  Discharge Exam: Blood pressure (!) 143/79, pulse 68, temperature 98.1 F (36.7 C), temperature source Oral, resp. rate 18, height 6\' 3"  (1.905 m), weight 121.1 kg (267 lb), SpO2 98 %. General: WDWN patient in NAD. Psych:  Appropriate mood and affect. Neuro:  A&O x 3, Moving all extremities, sensation intact to light touch HEENT:  EOMs intact Chest:  Even non-labored respirations Skin:  Dressing C/D/I, no rashes or lesions Extremities: warm/dry, mild edema, no erythema or echymosis.  No lymphadenopathy. Pulses: Dorsalis pedis and post tibialis 2+ MSK:  ROM: lacks 5 degrees of TKE, MMT: patient is able to perform quad set, (-) Homan's   Disposition: 01-Home or Self Care  Discharge Instructions    Call MD / Call 911    Complete by:  As directed    If you experience chest pain or shortness of breath, CALL 911 and be transported to the hospital emergency room.  If you develope a fever above 101 F, pus (white drainage) or increased drainage or redness at the wound, or calf pain, call your surgeon's  office.   Call MD / Call 911    Complete by:  As directed    If you experience chest pain or shortness of breath, CALL 911 and be transported to the hospital emergency room.  If you develope a fever above 101 F, pus (white drainage) or increased drainage or redness at the wound, or calf pain, call your surgeon's office.   Constipation Prevention    Complete by:  As directed    Drink plenty of fluids.  Prune juice may be helpful.  You may use a stool softener, such as Colace (over the counter) 100 mg twice a day.  Use MiraLax (over the counter) for constipation as needed.   Constipation Prevention    Complete by:  As directed    Drink plenty of fluids.  Prune juice may be helpful.  You may use a stool softener, such as Colace (over the counter) 100 mg twice a day.  Use MiraLax (over the counter) for constipation as needed.   Diet - low sodium heart healthy    Complete by:  As directed    Diet - low sodium heart healthy    Complete by:  As directed    Do not put a pillow under the knee. Place it under the heel.    Complete by:  As directed    Driving restrictions    Complete by:  As directed    No driving for 6 weeks   Increase activity slowly as tolerated    Complete by:  As directed    Increase activity slowly as tolerated    Complete by:  As directed    Lifting restrictions    Complete by:  As directed    No lifting for 6 weeks   TED hose    Complete by:  As directed    Use stockings (TED hose) for 2 weeks on both leg(s).  You may remove them at night for sleeping.     Allergies as of 08/11/2016   No Known Allergies     Medication List    STOP taking these medications   acetaminophen 500 MG tablet Commonly known as:  TYLENOL     TAKE these medications   aspirin 81 MG chewable tablet Chew 1 tablet (81 mg total) by mouth 2 (two) times daily.   diclofenac 75 MG EC tablet Commonly known as:  VOLTAREN Take 75 mg by mouth 2 (two) times daily.   docusate sodium 100 MG  capsule Commonly known as:  COLACE Take 1 capsule (100 mg total) by mouth 2 (two) times daily.   losartan 25 MG tablet Commonly known as:  COZAAR Take 25 mg by mouth daily.   ondansetron 4 MG tablet Commonly known as:  ZOFRAN Take 1 tablet (4 mg total) by mouth every 6 (six) hours as needed for nausea.   oxyCODONE 10 mg 12 hr tablet Commonly known as:  OXYCONTIN Take 1 tablet (10 mg total) by mouth PRO. 1 tab PO every 12 hours for 3 days, then 1 tab PO daily for 4 days   oxyCODONE-acetaminophen 5-325 MG tablet Commonly known as:  PERCOCET/ROXICET Take 1-2 tablets by mouth every 4 (four) hours as needed for moderate pain.   senna 8.6 MG Tabs tablet Commonly known as:  SENOKOT Take 2 tablets (17.2 mg total) by mouth at bedtime.            Durable Medical Equipment        Start     Ordered   08/09/16 1752  DME 3 n 1  Once     08/09/16 1751   08/09/16 1752  DME Walker rolling  Once    Question:  Patient needs a walker to treat with the following condition  Answer:  S/P total knee arthroplasty, left   08/09/16 1751     Follow-up Information    Latrel Szymczak, Cloyde Reams, MD. Schedule an appointment as soon as possible for a visit in 2 week(s).   Specialty:  Orthopedic Surgery Why:  For wound re-check Contact information: [address] [place] [address] [phone]           Signed: Joana Reamer [facility] Orthopaed[facility] Office:  [phone]

## 2016-08-11 NOTE — Progress Notes (Signed)
Received call from nurse.  Reports that patient is not comfortable with home virtual therapy and would prefer HHPT.  I have placed an order for face-to-face and HHPT.  Alfredo Martinez, PA-C [facility] Orthopaedics Office:  [phone]

## 2016-08-11 NOTE — Progress Notes (Signed)
Received call from nurse.  Patient has concerns over numbness along medial shin and great toe.  Denies calf pain, SOB, or CP.  Reassured that numbness is very common after lowering extremity procedures as post-op swelling increases pressure in the soft tissue which can result in numbness.  Numbness tends to resolve as swelling resolves.  Patient looked good early this morning during rounds with negative homan's sign.  Alfredo Martinez, PA-C [facility] Orthopaedics Office:  [phone]

## 2016-08-11 NOTE — Progress Notes (Signed)
OT TREATMENT AND DISCHARGE  08/11/16 1200  OT Visit Information  Last OT Received On 08/11/16  Assistance Needed +1  History of Present Illness s/p L TKA  Precautions  Precautions Knee  Pain Assessment  Pain Score 4  Pain Location L knee; reports bothersome numbness and tingling medial calf and tingling in toes; increased swelling noted  Pain Descriptors / Indicators Sore  Pain Intervention(s) Limited activity within patient's tolerance;Monitored during session;Premedicated before session;Repositioned;Ice applied (RN came to look at LLE)  Cognition  Arousal/Alertness Awake/alert  Behavior During Therapy [facility] for tasks assessed/performed  Overall Cognitive Status Within Functional Limits for tasks assessed  ADL  Overall ADL's  Needs assistance/impaired  Lower Body Dressing Modified independent;Sit to/from stand  General ADL Comments pt was able to don ted hose bil.  Concerned about swelling and abnormal sensation:  see pain above. RN aware.    Bed Mobility  General bed mobility comments oob  Restrictions  Other Position/Activity Restrictions WBAT  Transfers  Overall transfer level Modified independent  OT - End of Session  Activity Tolerance Patient tolerated treatment well  Patient left in chair;with call bell/phone within reach  OT Assessment/Plan  OT Visit Diagnosis Pain  Pain - Right/Left Left  Pain - part of body Knee  Follow Up Recommendations No OT follow up  OT Equipment None recommended by OT  AM-PAC OT "6 Clicks" Daily Activity Outcome Measure  Help from another person eating meals? 4  Help from another person taking care of personal grooming? 4  Help from another person toileting, which includes using toliet, bedpan, or urinal? 4  Help from another person bathing (including washing, rinsing, drying)? 4  Help from another person to put on and taking off regular upper body clothing? 4  Help from another person to put on and taking off regular lower body clothing? 4   6 Click Score 24  ADL G Code Conversion CH  OT Goal Progression  Progress towards OT goals Progressing toward goals (no further OT is needed at this time)  Acute Rehab OT Goals  Patient Stated Goal get life back and do everything I want  OT Time Calculation  OT Start Time (ACUTE ONLY) 1104  OT Stop Time (ACUTE ONLY) 1122  OT Time Calculation (min) 18 min  OT G-codes **NOT FOR INPATIENT CLASS**  Functional Assessment Tool Used AM-PAC 6 Clicks Daily Activity;Clinical judgement  Self Care Discharge Status (M6004) CH  OT General Charges  $OT Visit 1 Procedure  OT Treatments  $Self Care/Home Management  8-22 mins  Marica Otter, OTR/L [phone] 08/11/2016

## 2016-08-11 NOTE — Progress Notes (Addendum)
qPhysical Therapy Treatment Patient Details Name: Gerald Robles MRN: [id number] DOB: Dec 07, 1965 Today's Date: 08/11/2016    History of Present Illness s/p L TKA    PT Comments    Pt continues motivated but with increased pain this am.  Therex program performed and advanced.  Gait/stair training deferred at pt request.  Pt reports numbness and tingling in shin/calf below ace wrap, ace wrap removed and pt states feeling better by end of session.  RN aware.  Follow Up Recommendations  Other (comment)     Equipment Recommendations  None recommended by PT    Recommendations for Other Services OT consult     Precautions / Restrictions Precautions Precautions: Knee Restrictions Weight Bearing Restrictions: No Other Position/Activity Restrictions: WBAT    Mobility  Bed Mobility Overal bed mobility: Needs Assistance Bed Mobility: Supine to Sit     Supine to sit: Modified independent (Device/Increase time)     General bed mobility comments: pt self cueing  Transfers Overall transfer level: Needs assistance Equipment used: Rolling walker (2 wheeled) Transfers: Sit to/from Stand Sit to Stand: Supervision Stand pivot transfers: Modified independent (Device/Increase time)       General transfer comment: cue for LE management  Ambulation/Gait                 Stairs            Wheelchair Mobility    Modified Rankin (Stroke Patients Only)       Balance                                            Cognition Arousal/Alertness: Awake/alert Behavior During Therapy: WFL for tasks assessed/performed Overall Cognitive Status: Within Functional Limits for tasks assessed                                        Exercises Total Joint Exercises Ankle Circles/Pumps: AROM;Both;Supine;20 reps Quad Sets: AROM;Both;Supine;15 reps Heel Slides: AAROM;Left;Supine;20 reps (Pt self assisting with sheet) Straight Leg Raises:  AAROM;AROM;Left;15 reps;Supine Long Arc Quad: AAROM;AROM;Left;10 reps;Seated Knee Flexion: Seated;AAROM;AROM;Left;10 reps (Pt self assisting with R foot) Goniometric ROM: L knee -4 - 70    General Comments        Pertinent Vitals/Pain Pain Assessment: 0-10 Pain Score: 8  Pain Location: L knee Pain Descriptors / Indicators: Sore Pain Intervention(s): Limited activity within patient's tolerance;Monitored during session;Premedicated before session;Ice applied    Home Living                      Prior Function            PT Goals (current goals can now be found in the care plan section) Acute Rehab PT Goals Patient Stated Goal: get life back and do everything I want PT Goal Formulation: With patient Time For Goal Achievement: 08/13/16 Potential to Achieve Goals: Good Progress towards PT goals: Progressing toward goals    Frequency    7X/week      PT Plan Current plan remains appropriate    Co-evaluation             End of Session Equipment Utilized During Treatment: Gait belt Activity Tolerance: Patient tolerated treatment well Patient left: with call bell/phone within reach;in chair Nurse Communication: Mobility status PT Visit Diagnosis:  Unsteadiness on feet (R26.81);Difficulty in walking, not elsewhere classified (R26.2)     Time: 8469-6295 PT Time Calculation (min) (ACUTE ONLY): 32 min  Charges:  $Therapeutic Exercise: 23-37 mins                    G Codes:          Gerald Robles 08/11/2016, 11:26 AM

## 2016-08-11 NOTE — Progress Notes (Signed)
Subjective: 2 Days Post-Op Procedure(s) (LRB): LEFT TOTAL KNEE ARTHROPLASTY WITH COMPUTER NAVIGATION (Left)  Patient reports pain as mild to moderate.  Tolerating POs well.  Admits to flatulence.  Denies fever, chills, N/V, SOB, chest pain.  Has worked well with therapy.  Reports that he is ready to go home.  Objective:   VITALS:  Temp:  [98 F (36.7 C)-98.1 F (36.7 C)] 98.1 F (36.7 C) (04/07 0600) Pulse Rate:  [68-77] 68 (04/07 0600) Resp:  [16-18] 18 (04/07 0600) BP: (132-143)/(63-79) 143/79 (04/07 0600) SpO2:  [98 %-100 %] 98 % (04/07 0600)  General: WDWN patient in NAD. Psych:  Appropriate mood and affect. Neuro:  A&O x 3, Moving all extremities, sensation intact to light touch HEENT:  EOMs intact Chest:  Even non-labored respirations Skin:  Dressing C/D/I, no rashes or lesions Extremities: warm/dry, mild edema, no erythema or echymosis.  No lymphadenopathy. Pulses: Dorsalis pedis and post tibialis 2+ MSK:  ROM: lacks 5 degrees of TKE, MMT: patient is able to perform quad set, (-) Homan's    LABS  Recent Labs  08/10/16 0450 08/11/16 0449  HGB 9.3* 9.1*  WBC 7.2 7.8  PLT 240 232    Recent Labs  08/10/16 0450  NA 135  K 3.7  CL 103  CO2 26  BUN 7  CREATININE 0.69  GLUCOSE 161*   No results for input(s): LABPT, INR in the last 72 hours.   Assessment/Plan: 2 Days Post-Op Procedure(s) (LRB): LEFT TOTAL KNEE ARTHROPLASTY WITH COMPUTER NAVIGATION (Left)  Up with therapy WBAT L LE D/C home today. Plan for outpatient post-op visit with Dr. Linna Caprice.  Alfredo Martinez, PA-C [facility] Orthopaedics Office:  [phone]

## 2016-08-11 NOTE — Progress Notes (Signed)
qPhysical Therapy Treatment Patient Details Name: Gerald Robles MRN: [id number] DOB: Sep 04, 1965 Today's Date: 08/11/2016    History of Present Illness s/p L TKA    PT Comments    Pt progressing well.  Pt reviewed home therex, stairs, and don/doff KI for side sleeping.  Pt continues to report numbness lower L shin and foot - RN made aware.   Follow Up Recommendations  Other (comment)     Equipment Recommendations  None recommended by PT    Recommendations for Other Services OT consult     Precautions / Restrictions Precautions Precautions: Knee Restrictions Weight Bearing Restrictions: No Other Position/Activity Restrictions: WBAT    Mobility  Bed Mobility Overal bed mobility: Needs Assistance Bed Mobility: Supine to Sit     Supine to sit: Modified independent (Device/Increase time)     General bed mobility comments: pt self cueing  Transfers Overall transfer level: Modified independent Equipment used: Rolling walker (2 wheeled) Transfers: Sit to/from Stand Sit to Stand: Supervision Stand pivot transfers: Modified independent (Device/Increase time)       General transfer comment: pt self cueing  Ambulation/Gait Ambulation/Gait assistance: Supervision;Modified independent (Device/Increase time) Ambulation Distance (Feet): 100 Feet Assistive device: Rolling walker (2 wheeled) Gait Pattern/deviations: Step-to pattern;Step-through pattern;Decreased step length - right;Decreased step length - left;Shuffle Gait velocity: decr Gait velocity interpretation: Below normal speed for age/gender General Gait Details: min cues for position from RW   Stairs Stairs: Yes   Stair Management: One rail Left;Step to pattern;Forwards;With crutches Number of Stairs: 10 General stair comments: cues for sequence and foot/crutch placement  Wheelchair Mobility    Modified Rankin (Stroke Patients Only)       Balance                                             Cognition Arousal/Alertness: Awake/alert Behavior During Therapy: WFL for tasks assessed/performed Overall Cognitive Status: Within Functional Limits for tasks assessed                                        Exercises Total Joint Exercises Ankle Circles/Pumps: AROM;Both;Supine;20 reps Quad Sets: AROM;Both;Supine;15 reps Heel Slides: AAROM;Left;Supine;20 reps (Pt self assisting with sheet) Straight Leg Raises: AAROM;AROM;Left;15 reps;Supine Long Arc Quad: AAROM;AROM;Left;10 reps;Seated Knee Flexion: Seated;AAROM;AROM;Left;10 reps (Pt self assisting with R foot) Goniometric ROM: L knee -4 - 70    General Comments        Pertinent Vitals/Pain Pain Assessment: 0-10 Pain Score: 4  Pain Location: L knee Pain Descriptors / Indicators: Sore Pain Intervention(s): Limited activity within patient's tolerance;Monitored during session;Premedicated before session    Home Living                      Prior Function            PT Goals (current goals can now be found in the care plan section) Acute Rehab PT Goals Patient Stated Goal: get life back and do everything I want PT Goal Formulation: With patient Time For Goal Achievement: 08/13/16 Potential to Achieve Goals: Good Progress towards PT goals: Progressing toward goals    Frequency    7X/week      PT Plan Current plan remains appropriate    Co-evaluation  End of Session Equipment Utilized During Treatment: Gait belt Activity Tolerance: Patient tolerated treatment well Patient left: with call bell/phone within reach;in chair Nurse Communication: Mobility status PT Visit Diagnosis: Unsteadiness on feet (R26.81);Difficulty in walking, not elsewhere classified (R26.2)     Time: 0156-1537 PT Time Calculation (min) (ACUTE ONLY): 34 min  Charges:  $Gait Training: 8-22 mins $Therapeutic Exercise: 23-37 mins $Therapeutic Activity: 8-22 mins                    G Codes:        [id number]   Gerald Robles 08/11/2016, 11:35 AM

## 2016-08-11 NOTE — Progress Notes (Signed)
Pt's vitals are WNL, tolerating diet, pain is under control and the numbness, swelling and tingling in LLE is slowly resolving. Discussed discharge instructions with patient. All questions and concerns addressed. Discharged to home with prescriptions.

## 2016-11-20 ENCOUNTER — Ambulatory Visit: Payer: Self-pay | Admitting: Orthopedic Surgery

## 2016-11-20 NOTE — H&P (Signed)
TOTAL KNEE ADMISSION H&P  Patient is being admitted for right total knee arthroplasty.  Subjective:  Chief Complaint:right knee pain.  HPI: Gerald Robles, 51 y.o. male, has a history of pain and functional disability in the right knee due to trauma and has failed non-surgical conservative treatments for greater than 12 weeks to includeNSAID's and/or analgesics, corticosteriod injections, flexibility and strengthening excercises, supervised PT with diminished ADL's post treatment, use of assistive devices, weight reduction as appropriate and activity modification.  Onset of symptoms was gradual, starting >10 years ago with gradually worsening course since that time. The patient noted prior procedures on the knee to include  ACL reconstruction on the bilaterally knee(s).  Patient currently rates pain in the right knee(s) at 10 out of 10 with activity. Patient has night pain, worsening of pain with activity and weight bearing, pain that interferes with activities of daily living, pain with passive range of motion, crepitus and joint swelling.  Patient has evidence of subchondral cysts, subchondral sclerosis, periarticular osteophytes, joint subluxation, joint space narrowing and retained hardware by imaging studies. There is no active infection.  Patient Active Problem List   Diagnosis Date Noted  . Osteoarthritis of left knee 08/09/2016  . Paroxysmal atrial fibrillation (HCC) 05/04/2015   Past Medical History:  Diagnosis Date  . A-fib (HCC)   . Anemia   . Anemia   . Arthritis   . ED (erectile dysfunction)   . Hypertension   . Sarcoidosis 2003    Past Surgical History:  Procedure Laterality Date  . KNEE ARTHROPLASTY Left 08/09/2016   Procedure: LEFT TOTAL KNEE ARTHROPLASTY WITH COMPUTER NAVIGATION;  Surgeon: Samson Frederic, MD;  Location: [facility] ORS;  Service: Orthopedics;  Laterality: Left;  Needs RNFA  . KNEE SURGERY Bilateral 1990   right acl with arthsroscopy; left kee artshroscopy   .  SHOULDER SURGERY Right    twice  . VASECTOMY       (Not in a hospital admission) No Known Allergies  Social History  Substance Use Topics  . Smoking status: Never Smoker  . Smokeless tobacco: Never Used  . Alcohol use 24.6 oz/week    41 Cans of beer per week    Family History  Problem Relation Age of Onset  . Hypertension Mother      Review of Systems  Constitutional: Negative.   HENT: Positive for hearing loss.   Eyes: Negative.   Respiratory: Negative.   Cardiovascular: Negative.   Gastrointestinal: Negative.   Genitourinary: Negative.   Musculoskeletal: Positive for back pain, joint pain and myalgias.  Skin: Negative.   Neurological: Negative.   Endo/Heme/Allergies: Negative.   Psychiatric/Behavioral: Negative.     Objective:  Physical Exam  Vitals reviewed. Constitutional: He is oriented to person, place, and time. He appears well-developed and well-nourished.  HENT:  Head: Normocephalic and atraumatic.  Eyes: Pupils are equal, round, and reactive to light. Conjunctivae and EOM are normal.  Neck: Normal range of motion. Neck supple.  Cardiovascular: Normal rate, regular rhythm and intact distal pulses.   Respiratory: Effort normal. No respiratory distress.  GI: Soft. He exhibits no distension.  Genitourinary:  Genitourinary Comments: deferred  Musculoskeletal:       Legs: Neurological: He is alert and oriented to person, place, and time. He has normal reflexes.  Skin: Skin is warm and dry.  Psychiatric: He has a normal mood and affect. His behavior is normal. Judgment and thought content normal.    Vital signs in last 24 hours: @VSRANGES @  Labs:  Estimated body mass index is 33.37 kg/m as calculated from the following:   Height as of 08/09/16: 6\' 3"  (1.905 m).   Weight as of 08/09/16: 121.1 kg (267 lb).   Imaging Review Plain radiographs demonstrate severe degenerative joint disease of the right knee(s). The overall alignment issignificant varus.  The bone quality appears to be adequate for age and reported activity level.  Assessment/Plan:  End stage arthritis, right knee   The patient history, physical examination, clinical judgment of the provider and imaging studies are consistent with end stage degenerative joint disease of the right knee(s) and total knee arthroplasty is deemed medically necessary. The treatment options including medical management, injection therapy arthroscopy and arthroplasty were discussed at length. The risks and benefits of total knee arthroplasty were presented and reviewed. The risks due to aseptic loosening, infection, stiffness, patella tracking problems, thromboembolic complications and other imponderables were discussed. The patient acknowledged the explanation, agreed to proceed with the plan and consent was signed. Patient is being admitted for inpatient treatment for surgery, pain control, PT, OT, prophylactic antibiotics, VTE prophylaxis, progressive ambulation and ADL's and discharge planning. The patient is planning to be discharged home with outpatient PT

## 2016-12-03 NOTE — Progress Notes (Signed)
Please place orders in epic pt. Has preop appt. 12/06/16. thank you

## 2016-12-03 NOTE — Patient Instructions (Addendum)
Gerald Robles  12/03/2016   Your procedure is scheduled on: 12/13/16  Report to [facility] Main  Entrance Take [place]  elevators to 3rd floor to  Short Stay Center at    1045 AM.    Call this number if you have problems the morning of surgery [phone]    Remember: ONLY 1 PERSON MAY GO WITH YOU TO SHORT STAY TO GET  READY MORNING OF YOUR SURGERY.  Do not eat food or drink liquids :After Midnight.     Take these medicines the morning of surgery with A SIP OF WATER: NONE                                You may not have any metal on your body including hair pins and              piercings  Do not wear jewelry, lotions, powders or perfumes, deodorant                  Men may shave face and neck.   Do not bring valuables to the hospital. [place] IS NOT             RESPONSIBLE   FOR VALUABLES.  Contacts, dentures or bridgework may not be worn into surgery.  Leave suitcase in the car. After surgery it may be brought to your room.               Please read over the following fact sheets you were given: _____________________________________________________________________           [facility] - Preparing for Surgery Before surgery, you can play an important role.  Because skin is not sterile, your skin needs to be as free of germs as possible.  You can reduce the number of germs on your skin by washing with CHG (chlorahexidine gluconate) soap before surgery.  CHG is an antiseptic cleaner which kills germs and bonds with the skin to continue killing germs even after washing. Please DO NOT use if you have an allergy to CHG or antibacterial soaps.  If your skin becomes reddened/irritated stop using the CHG and inform your nurse when you arrive at Short Stay. Do not shave (including legs and underarms) for at least 48 hours prior to the first CHG shower.  You may shave your face/neck. Please follow these instructions carefully:  1.  Shower with CHG Soap the night  before surgery and the  morning of Surgery.  2.  If you choose to wash your hair, wash your hair first as usual with your  normal  shampoo.  3.  After you shampoo, rinse your hair and body thoroughly to remove the  shampoo.                           4.  Use CHG as you would any other liquid soap.  You can apply chg directly  to the skin and wash                       Gently with a scrungie or clean washcloth.  5.  Apply the CHG Soap to your body ONLY FROM THE NECK DOWN.   Do not use on face/ open  Wound or open sores. Avoid contact with eyes, ears mouth and genitals (private parts).                       Wash face,  Genitals (private parts) with your normal soap.             6.  Wash thoroughly, paying special attention to the area where your surgery  will be performed.  7.  Thoroughly rinse your body with warm water from the neck down.  8.  DO NOT shower/wash with your normal soap after using and rinsing off  the CHG Soap.                9.  Pat yourself dry with a clean towel.            10.  Wear clean pajamas.            11.  Place clean sheets on your bed the night of your first shower and do not  sleep with pets. Day of Surgery : Do not apply any lotions/deodorants the morning of surgery.  Please wear clean clothes to the hospital/surgery center.  FAILURE TO FOLLOW THESE INSTRUCTIONS MAY RESULT IN THE CANCELLATION OF YOUR SURGERY PATIENT SIGNATURE_________________________________  NURSE SIGNATURE__________________________________  ________________________________________________________________________  WHAT IS A BLOOD TRANSFUSION? Blood Transfusion Information  A transfusion is the replacement of blood or some of its parts. Blood is made up of multiple cells which provide different functions.  Red blood cells carry oxygen and are used for blood loss replacement.  White blood cells fight against infection.  Platelets control bleeding.  Plasma helps clot  blood.  Other blood products are available for specialized needs, such as hemophilia or other clotting disorders. BEFORE THE TRANSFUSION  Who gives blood for transfusions?   Healthy volunteers who are fully evaluated to make sure their blood is safe. This is blood bank blood. Transfusion therapy is the safest it has ever been in the practice of medicine. Before blood is taken from a donor, a complete history is taken to make sure that person has no history of diseases nor engages in risky social behavior (examples are intravenous drug use or sexual activity with multiple partners). The donor's travel history is screened to minimize risk of transmitting infections, such as malaria. The donated blood is tested for signs of infectious diseases, such as HIV and hepatitis. The blood is then tested to be sure it is compatible with you in order to minimize the chance of a transfusion reaction. If you or a relative donates blood, this is often done in anticipation of surgery and is not appropriate for emergency situations. It takes many days to process the donated blood. RISKS AND COMPLICATIONS Although transfusion therapy is very safe and saves many lives, the main dangers of transfusion include:   Getting an infectious disease.  Developing a transfusion reaction. This is an allergic reaction to something in the blood you were given. Every precaution is taken to prevent this. The decision to have a blood transfusion has been considered carefully by your caregiver before blood is given. Blood is not given unless the benefits outweigh the risks. AFTER THE TRANSFUSION  Right after receiving a blood transfusion, you will usually feel much better and more energetic. This is especially true if your red blood cells have gotten low (anemic). The transfusion raises the level of the red blood cells which carry oxygen, and this usually causes an energy increase.  The  nurse administering the transfusion will monitor  you carefully for complications. HOME CARE INSTRUCTIONS  No special instructions are needed after a transfusion. You may find your energy is better. Speak with your caregiver about any limitations on activity for underlying diseases you may have. SEEK MEDICAL CARE IF:   Your condition is not improving after your transfusion.  You develop redness or irritation at the intravenous (IV) site. SEEK IMMEDIATE MEDICAL CARE IF:  Any of the following symptoms occur over the next 12 hours:  Shaking chills.  You have a temperature by mouth above 102 F (38.9 C), not controlled by medicine.  Chest, back, or muscle pain.  People around you feel you are not acting correctly or are confused.  Shortness of breath or difficulty breathing.  Dizziness and fainting.  You get a rash or develop hives.  You have a decrease in urine output.  Your urine turns a dark color or changes to pink, red, or brown. Any of the following symptoms occur over the next 10 days:  You have a temperature by mouth above 102 F (38.9 C), not controlled by medicine.  Shortness of breath.  Weakness after normal activity.  The white part of the eye turns yellow (jaundice).  You have a decrease in the amount of urine or are urinating less often.  Your urine turns a dark color or changes to pink, red, or brown. Document Released: 04/20/2000 Document Revised: 07/16/2011 Document Reviewed: 12/08/2007 ExitCare Patient Information 2014 [place].  _______________________________________________________________________  Incentive Spirometer  An incentive spirometer is a tool that can help keep your lungs clear and active. This tool measures how well you are filling your lungs with each breath. Taking long deep breaths may help reverse or decrease the chance of developing breathing (pulmonary) problems (especially infection) following:  A long period of time when you are unable to move or be active. BEFORE THE  PROCEDURE   If the spirometer includes an indicator to show your best effort, your nurse or respiratory therapist will set it to a desired goal.  If possible, sit up straight or lean slightly forward. Try not to slouch.  Hold the incentive spirometer in an upright position. INSTRUCTIONS FOR USE  1. Sit on the edge of your bed if possible, or sit up as far as you can in bed or on a chair. 2. Hold the incentive spirometer in an upright position. 3. Breathe out normally. 4. Place the mouthpiece in your mouth and seal your lips tightly around it. 5. Breathe in slowly and as deeply as possible, raising the piston or the ball toward the top of the column. 6. Hold your breath for 3-5 seconds or for as long as possible. Allow the piston or ball to fall to the bottom of the column. 7. Remove the mouthpiece from your mouth and breathe out normally. 8. Rest for a few seconds and repeat Steps 1 through 7 at least 10 times every 1-2 hours when you are awake. Take your time and take a few normal breaths between deep breaths. 9. The spirometer may include an indicator to show your best effort. Use the indicator as a goal to work toward during each repetition. 10. After each set of 10 deep breaths, practice coughing to be sure your lungs are clear. If you have an incision (the cut made at the time of surgery), support your incision when coughing by placing a pillow or rolled up towels firmly against it. Once you are able to get  out of bed, walk around indoors and cough well. You may stop using the incentive spirometer when instructed by your caregiver.  RISKS AND COMPLICATIONS  Take your time so you do not get dizzy or light-headed.  If you are in pain, you may need to take or ask for pain medication before doing incentive spirometry. It is harder to take a deep breath if you are having pain. AFTER USE  Rest and breathe slowly and easily.  It can be helpful to keep track of a log of your progress. Your  caregiver can provide you with a simple table to help with this. If you are using the spirometer at home, follow these instructions: [place] IF:   You are having difficultly using the spirometer.  You have trouble using the spirometer as often as instructed.  Your pain medication is not giving enough relief while using the spirometer.  You develop fever of 100.5 F (38.1 C) or higher. SEEK IMMEDIATE MEDICAL CARE IF:   You cough up bloody sputum that had not been present before.  You develop fever of 102 F (38.9 C) or greater.  You develop worsening pain at or near the incision site. MAKE SURE YOU:   Understand these instructions.  Will watch your condition.  Will get help right away if you are not doing well or get worse. Document Released: 09/03/2006 Document Revised: 07/16/2011 Document Reviewed: 11/04/2006 [employer] Patient Information 2014 [place], [place].   ________________________________________________________________________

## 2016-12-04 ENCOUNTER — Ambulatory Visit: Payer: Self-pay | Admitting: Orthopedic Surgery

## 2016-12-06 ENCOUNTER — Encounter (HOSPITAL_COMMUNITY)
Admission: RE | Admit: 2016-12-06 | Discharge: 2016-12-06 | Disposition: A | Discharge status: Home / Self Care | Payer: Non-veteran care | Source: Ambulatory Visit | Attending: Orthopedic Surgery | Admitting: Orthopedic Surgery

## 2016-12-06 ENCOUNTER — Encounter (HOSPITAL_COMMUNITY): Payer: Self-pay

## 2016-12-06 DIAGNOSIS — Z01818 Encounter for other preprocedural examination: Secondary | ICD-10-CM | POA: Insufficient documentation

## 2016-12-06 DIAGNOSIS — M1711 Unilateral primary osteoarthritis, right knee: Secondary | ICD-10-CM | POA: Diagnosis not present

## 2016-12-06 LAB — COMPREHENSIVE METABOLIC PANEL
ALBUMIN: 4.1 g/dL (ref 3.5–5.0)
ALT: 29 U/L (ref 17–63)
ANION GAP: 7 (ref 5–15)
AST: 30 U/L (ref 15–41)
Alkaline Phosphatase: 110 U/L (ref 38–126)
BILIRUBIN TOTAL: 0.7 mg/dL (ref 0.3–1.2)
BUN: 7 mg/dL (ref 6–20)
CO2: 26 mmol/L (ref 22–32)
Calcium: 9 mg/dL (ref 8.9–10.3)
Chloride: 105 mmol/L (ref 101–111)
Creatinine, Ser: 0.74 mg/dL (ref 0.61–1.24)
GFR calc Af Amer: >60 mL/min (ref >60)
GFR calc non Af Amer: >60 mL/min (ref >60)
GLUCOSE: 100 mg/dL — AB (ref 65–99)
POTASSIUM: 3.7 mmol/L (ref 3.5–5.1)
SODIUM: 138 mmol/L (ref 135–145)
TOTAL PROTEIN: 7.1 g/dL (ref 6.5–8.1)

## 2016-12-06 LAB — CBC
HEMATOCRIT: 36.5 % — AB (ref 39.0–52.0)
HEMOGLOBIN: 12.2 g/dL — AB (ref 13.0–17.0)
MCH: 29.3 pg (ref 26.0–34.0)
MCHC: 33.4 g/dL (ref 30.0–36.0)
MCV: 87.5 fL (ref 78.0–100.0)
Platelets: 230 10*3/uL (ref 150–400)
RBC: 4.17 MIL/uL — ABNORMAL LOW (ref 4.22–5.81)
RDW: 13.9 % (ref 11.5–15.5)
WBC: 5.1 10*3/uL (ref 4.0–10.5)

## 2016-12-06 LAB — SURGICAL PCR SCREEN
MRSA, PCR: NEGATIVE
Staphylococcus aureus: NEGATIVE

## 2016-12-06 NOTE — Progress Notes (Signed)
ekg 07/24/16 epic Echo 2017 epic

## 2016-12-13 ENCOUNTER — Inpatient Hospital Stay (HOSPITAL_COMMUNITY): Payer: Non-veteran care | Admitting: Anesthesiology

## 2016-12-13 ENCOUNTER — Inpatient Hospital Stay (HOSPITAL_COMMUNITY)
Admission: RE | Admit: 2016-12-13 | Discharge: 2016-12-15 | DRG: 470 | Disposition: A | Discharge status: Home Health | Payer: Non-veteran care | Attending: Orthopedic Surgery | Admitting: Orthopedic Surgery

## 2016-12-13 ENCOUNTER — Encounter (HOSPITAL_COMMUNITY): Payer: Self-pay | Admitting: *Deleted

## 2016-12-13 ENCOUNTER — Inpatient Hospital Stay (HOSPITAL_COMMUNITY): Payer: Non-veteran care

## 2016-12-13 ENCOUNTER — Encounter (HOSPITAL_COMMUNITY)
Admission: RE | Disposition: A | Discharge status: Home Health | Payer: Self-pay | Source: Home / Self Care | Attending: Orthopedic Surgery

## 2016-12-13 DIAGNOSIS — M1731 Unilateral post-traumatic osteoarthritis, right knee: Principal | ICD-10-CM | POA: Diagnosis present

## 2016-12-13 DIAGNOSIS — N529 Male erectile dysfunction, unspecified: Secondary | ICD-10-CM | POA: Diagnosis present

## 2016-12-13 DIAGNOSIS — D62 Acute posthemorrhagic anemia: Secondary | ICD-10-CM | POA: Diagnosis not present

## 2016-12-13 DIAGNOSIS — Z8249 Family history of ischemic heart disease and other diseases of the circulatory system: Secondary | ICD-10-CM

## 2016-12-13 DIAGNOSIS — D869 Sarcoidosis, unspecified: Secondary | ICD-10-CM | POA: Diagnosis present

## 2016-12-13 DIAGNOSIS — I10 Essential (primary) hypertension: Secondary | ICD-10-CM | POA: Diagnosis present

## 2016-12-13 DIAGNOSIS — Z09 Encounter for follow-up examination after completed treatment for conditions other than malignant neoplasm: Secondary | ICD-10-CM

## 2016-12-13 DIAGNOSIS — M25561 Pain in right knee: Secondary | ICD-10-CM | POA: Diagnosis present

## 2016-12-13 DIAGNOSIS — Z96652 Presence of left artificial knee joint: Secondary | ICD-10-CM | POA: Diagnosis present

## 2016-12-13 DIAGNOSIS — I4891 Unspecified atrial fibrillation: Secondary | ICD-10-CM | POA: Diagnosis present

## 2016-12-13 HISTORY — PX: KNEE ARTHROPLASTY: SHX992

## 2016-12-13 LAB — POCT I-STAT 4, (NA,K, GLUC, HGB,HCT)
Glucose, Bld: 104 mg/dL — ABNORMAL HIGH (ref 65–99)
HCT: 29 % — ABNORMAL LOW (ref 39.0–52.0)
Hemoglobin: 9.9 g/dL — ABNORMAL LOW (ref 13.0–17.0)
Potassium: 3.9 mmol/L (ref 3.5–5.1)
SODIUM: 141 mmol/L (ref 135–145)

## 2016-12-13 LAB — TYPE AND SCREEN
ABO/RH(D): O NEG
ANTIBODY SCREEN: NEGATIVE

## 2016-12-13 SURGERY — ARTHROPLASTY, KNEE, TOTAL, USING IMAGELESS COMPUTER-ASSISTED NAVIGATION
Anesthesia: General | Laterality: Right

## 2016-12-13 MED ORDER — MIDAZOLAM HCL 5 MG/5ML IJ SOLN
INTRAMUSCULAR | Status: DC | PRN
  Administered 2016-12-13: 2 mg via INTRAVENOUS

## 2016-12-13 MED ORDER — ONDANSETRON HCL 4 MG PO TABS: 4.0000 mg | ORAL_TABLET | Freq: Four times a day (QID) | ORAL | Status: DC | PRN

## 2016-12-13 MED ORDER — METOCLOPRAMIDE HCL 5 MG PO TABS: 5.0000 mg | ORAL_TABLET | Freq: Three times a day (TID) | ORAL | Status: DC | PRN

## 2016-12-13 MED ORDER — LIDOCAINE HCL 1 % IJ SOLN
INTRAMUSCULAR | Status: DC | PRN
  Administered 2016-12-13: 80 mg via INTRADERMAL

## 2016-12-13 MED ORDER — STERILE WATER FOR IRRIGATION IR SOLN
Status: DC | PRN
  Administered 2016-12-13: 2000 mL

## 2016-12-13 MED ORDER — DIPHENHYDRAMINE HCL 12.5 MG/5ML PO ELIX: 12.5000 mg | ORAL_SOLUTION | ORAL | Status: DC | PRN

## 2016-12-13 MED ORDER — SUFENTANIL CITRATE 50 MCG/ML IV SOLN
INTRAVENOUS | Status: AC
  Filled 2016-12-13: qty 1

## 2016-12-13 MED ORDER — MIDAZOLAM HCL 5 MG/ML IJ SOLN
2.0000 mg | Freq: Once | INTRAMUSCULAR | Status: AC
  Administered 2016-12-13: 2 mg via INTRAVENOUS

## 2016-12-13 MED ORDER — METHOCARBAMOL 1000 MG/10ML IJ SOLN
500.0000 mg | Freq: Four times a day (QID) | INTRAMUSCULAR | Status: DC | PRN
  Administered 2016-12-13: 500 mg via INTRAVENOUS
  Filled 2016-12-13: qty 550

## 2016-12-13 MED ORDER — HYDROMORPHONE HCL-NACL 0.5-0.9 MG/ML-% IV SOSY
0.2500 mg | PREFILLED_SYRINGE | INTRAVENOUS | Status: DC | PRN
  Administered 2016-12-13 (×4): 0.5 mg via INTRAVENOUS

## 2016-12-13 MED ORDER — DOCUSATE SODIUM 100 MG PO CAPS
100.0000 mg | ORAL_CAPSULE | Freq: Two times a day (BID) | ORAL | Status: DC
  Administered 2016-12-13 – 2016-12-15 (×4): 100 mg via ORAL
  Filled 2016-12-13 (×4): qty 1

## 2016-12-13 MED ORDER — MIDAZOLAM HCL 2 MG/2ML IJ SOLN
INTRAMUSCULAR | Status: AC
  Filled 2016-12-13: qty 2

## 2016-12-13 MED ORDER — DEXAMETHASONE SODIUM PHOSPHATE 10 MG/ML IJ SOLN
INTRAMUSCULAR | Status: DC | PRN
  Administered 2016-12-13: 10 mg via INTRAVENOUS

## 2016-12-13 MED ORDER — SODIUM CHLORIDE 0.9 % IR SOLN
Status: DC | PRN
  Administered 2016-12-13: 1000 mL

## 2016-12-13 MED ORDER — SODIUM CHLORIDE 0.9 % IV SOLN
INTRAVENOUS | Status: DC
  Administered 2016-12-13: 19:00:00 via INTRAVENOUS

## 2016-12-13 MED ORDER — ALUM & MAG HYDROXIDE-SIMETH 200-200-20 MG/5ML PO SUSP: 30.0000 mL | ORAL | Status: DC | PRN

## 2016-12-13 MED ORDER — HYDROMORPHONE HCL 2 MG/ML IJ SOLN
INTRAMUSCULAR | Status: AC
  Filled 2016-12-13: qty 1

## 2016-12-13 MED ORDER — ISOPROPYL ALCOHOL 70 % SOLN
Status: DC | PRN
  Administered 2016-12-13: 1 via TOPICAL

## 2016-12-13 MED ORDER — FENTANYL CITRATE (PF) 100 MCG/2ML IJ SOLN
100.0000 ug | Freq: Once | INTRAMUSCULAR | Status: AC
  Administered 2016-12-13: 100 ug via INTRAVENOUS

## 2016-12-13 MED ORDER — SUGAMMADEX SODIUM 200 MG/2ML IV SOLN
INTRAVENOUS | Status: DC | PRN
  Administered 2016-12-13: 200 mg via INTRAVENOUS

## 2016-12-13 MED ORDER — HYDROMORPHONE HCL 1 MG/ML IJ SOLN
INTRAMUSCULAR | Status: DC | PRN
  Administered 2016-12-13 (×2): 1 mg via INTRAVENOUS

## 2016-12-13 MED ORDER — SUGAMMADEX SODIUM 200 MG/2ML IV SOLN
INTRAVENOUS | Status: AC
  Filled 2016-12-13: qty 2

## 2016-12-13 MED ORDER — HYDROMORPHONE HCL-NACL 0.5-0.9 MG/ML-% IV SOSY
0.5000 mg | PREFILLED_SYRINGE | INTRAVENOUS | Status: DC | PRN
  Administered 2016-12-14: 02:00:00 1 mg via INTRAVENOUS
  Filled 2016-12-13: qty 2

## 2016-12-13 MED ORDER — CHLORHEXIDINE GLUCONATE 4 % EX LIQD: 60.0000 mL | Freq: Once | CUTANEOUS | Status: DC

## 2016-12-13 MED ORDER — LACTATED RINGERS IV SOLN
INTRAVENOUS | Status: DC
  Administered 2016-12-13 (×7): via INTRAVENOUS

## 2016-12-13 MED ORDER — MENTHOL 3 MG MT LOZG: 1.0000 | LOZENGE | OROMUCOSAL | Status: DC | PRN

## 2016-12-13 MED ORDER — HYDROCODONE-ACETAMINOPHEN 5-325 MG PO TABS
1.0000 | ORAL_TABLET | ORAL | Status: DC | PRN
  Administered 2016-12-13 – 2016-12-14 (×3): 2 via ORAL
  Filled 2016-12-13 (×3): qty 2

## 2016-12-13 MED ORDER — ACETAMINOPHEN 10 MG/ML IV SOLN
1000.0000 mg | INTRAVENOUS | Status: AC
  Administered 2016-12-13: 1000 mg via INTRAVENOUS
  Filled 2016-12-13: qty 100

## 2016-12-13 MED ORDER — KETOROLAC TROMETHAMINE 30 MG/ML IJ SOLN
INTRAMUSCULAR | Status: DC | PRN
  Administered 2016-12-13: 30 mg via INTRAVENOUS

## 2016-12-13 MED ORDER — SODIUM CHLORIDE 0.9 % IR SOLN
Status: DC | PRN
  Administered 2016-12-13: 3000 mL

## 2016-12-13 MED ORDER — SUFENTANIL CITRATE 50 MCG/ML IV SOLN
INTRAVENOUS | Status: DC | PRN
  Administered 2016-12-13 (×2): 10 ug via INTRAVENOUS
  Administered 2016-12-13: 15 ug via INTRAVENOUS
  Administered 2016-12-13 (×2): 10 ug via INTRAVENOUS
  Administered 2016-12-13: 5 ug via INTRAVENOUS
  Administered 2016-12-13 (×3): 10 ug via INTRAVENOUS
  Administered 2016-12-13: 5 ug via INTRAVENOUS

## 2016-12-13 MED ORDER — LACTATED RINGERS IV SOLN
INTRAVENOUS | Status: DC | PRN
  Administered 2016-12-13 (×2): via INTRAVENOUS

## 2016-12-13 MED ORDER — SODIUM CHLORIDE 0.9 % IV SOLN: INTRAVENOUS | Status: DC

## 2016-12-13 MED ORDER — ONDANSETRON HCL 4 MG/2ML IJ SOLN: 4.0000 mg | Freq: Four times a day (QID) | INTRAMUSCULAR | Status: DC | PRN

## 2016-12-13 MED ORDER — ONDANSETRON HCL 4 MG/2ML IJ SOLN
INTRAMUSCULAR | Status: DC | PRN
  Administered 2016-12-13: 4 mg via INTRAVENOUS

## 2016-12-13 MED ORDER — KETOROLAC TROMETHAMINE 15 MG/ML IJ SOLN
15.0000 mg | Freq: Four times a day (QID) | INTRAMUSCULAR | Status: AC
  Administered 2016-12-13 – 2016-12-14 (×4): 15 mg via INTRAVENOUS
  Filled 2016-12-13 (×4): qty 1

## 2016-12-13 MED ORDER — PROPOFOL 10 MG/ML IV BOLUS
INTRAVENOUS | Status: DC | PRN
  Administered 2016-12-13: 240 mg via INTRAVENOUS
  Administered 2016-12-13: 40 mg via INTRAVENOUS

## 2016-12-13 MED ORDER — LOSARTAN POTASSIUM 50 MG PO TABS
50.0000 mg | ORAL_TABLET | Freq: Every day | ORAL | Status: DC
  Administered 2016-12-14 – 2016-12-15 (×2): 50 mg via ORAL
  Filled 2016-12-13 (×2): qty 1

## 2016-12-13 MED ORDER — CEFAZOLIN SODIUM-DEXTROSE 2-4 GM/100ML-% IV SOLN
2.0000 g | INTRAVENOUS | Status: AC
  Administered 2016-12-13: 2 g via INTRAVENOUS
  Filled 2016-12-13: qty 100

## 2016-12-13 MED ORDER — FENTANYL CITRATE (PF) 100 MCG/2ML IJ SOLN
INTRAMUSCULAR | Status: AC
  Administered 2016-12-13: 100 ug via INTRAVENOUS
  Filled 2016-12-13: qty 2

## 2016-12-13 MED ORDER — PROPOFOL 10 MG/ML IV BOLUS
INTRAVENOUS | Status: AC
  Filled 2016-12-13: qty 60

## 2016-12-13 MED ORDER — ALBUMIN HUMAN 5 % IV SOLN
INTRAVENOUS | Status: DC | PRN
  Administered 2016-12-13 (×2): via INTRAVENOUS

## 2016-12-13 MED ORDER — FENTANYL CITRATE (PF) 100 MCG/2ML IJ SOLN
INTRAMUSCULAR | Status: AC
  Filled 2016-12-13: qty 2

## 2016-12-13 MED ORDER — ALBUMIN HUMAN 5 % IV SOLN
INTRAVENOUS | Status: AC
  Filled 2016-12-13: qty 500

## 2016-12-13 MED ORDER — DEXAMETHASONE SODIUM PHOSPHATE 10 MG/ML IJ SOLN
10.0000 mg | Freq: Once | INTRAMUSCULAR | Status: AC
  Administered 2016-12-14: 10 mg via INTRAVENOUS
  Filled 2016-12-13: qty 1

## 2016-12-13 MED ORDER — METHOCARBAMOL 500 MG PO TABS
500.0000 mg | ORAL_TABLET | Freq: Four times a day (QID) | ORAL | Status: DC | PRN
  Administered 2016-12-14 – 2016-12-15 (×5): 500 mg via ORAL
  Filled 2016-12-13 (×5): qty 1

## 2016-12-13 MED ORDER — ROCURONIUM BROMIDE 100 MG/10ML IV SOLN
INTRAVENOUS | Status: DC | PRN
  Administered 2016-12-13: 20 mg via INTRAVENOUS
  Administered 2016-12-13: 30 mg via INTRAVENOUS
  Administered 2016-12-13: 70 mg via INTRAVENOUS

## 2016-12-13 MED ORDER — KETOROLAC TROMETHAMINE 30 MG/ML IJ SOLN
INTRAMUSCULAR | Status: AC
  Filled 2016-12-13: qty 1

## 2016-12-13 MED ORDER — BUPIVACAINE-EPINEPHRINE 0.25% -1:200000 IJ SOLN
INTRAMUSCULAR | Status: DC | PRN
  Administered 2016-12-13: 30 mL

## 2016-12-13 MED ORDER — ACETAMINOPHEN 650 MG RE SUPP: 650.0000 mg | Freq: Four times a day (QID) | RECTAL | Status: DC | PRN

## 2016-12-13 MED ORDER — ASPIRIN 81 MG PO CHEW
81.0000 mg | CHEWABLE_TABLET | Freq: Two times a day (BID) | ORAL | Status: DC
  Administered 2016-12-13 – 2016-12-15 (×4): 81 mg via ORAL
  Filled 2016-12-13 (×4): qty 1

## 2016-12-13 MED ORDER — FENTANYL CITRATE (PF) 100 MCG/2ML IJ SOLN: INTRAMUSCULAR | Status: DC | PRN

## 2016-12-13 MED ORDER — PHENOL 1.4 % MT LIQD: 1.0000 | OROMUCOSAL | Status: DC | PRN

## 2016-12-13 MED ORDER — SODIUM CHLORIDE 0.9 % IJ SOLN
INTRAMUSCULAR | Status: AC
Start: 2016-12-13 — End: 2016-12-13
  Filled 2016-12-13: qty 50

## 2016-12-13 MED ORDER — ACETAMINOPHEN 325 MG PO TABS: 650.0000 mg | ORAL_TABLET | Freq: Four times a day (QID) | ORAL | Status: DC | PRN

## 2016-12-13 MED ORDER — SODIUM CHLORIDE 0.9 % IJ SOLN
INTRAMUSCULAR | Status: DC | PRN
  Administered 2016-12-13: 20 mL

## 2016-12-13 MED ORDER — TRANEXAMIC ACID 1000 MG/10ML IV SOLN
1000.0000 mg | INTRAVENOUS | Status: AC
  Administered 2016-12-13: 1000 mg via INTRAVENOUS
  Filled 2016-12-13: qty 1100

## 2016-12-13 MED ORDER — PROPOFOL 10 MG/ML IV BOLUS
INTRAVENOUS | Status: AC
  Filled 2016-12-13: qty 20

## 2016-12-13 MED ORDER — POVIDONE-IODINE 10 % EX SWAB: 2.0000 "application " | Freq: Once | CUTANEOUS | Status: DC

## 2016-12-13 MED ORDER — DEXAMETHASONE SODIUM PHOSPHATE 10 MG/ML IJ SOLN
INTRAMUSCULAR | Status: AC
  Filled 2016-12-13: qty 1

## 2016-12-13 MED ORDER — TRANEXAMIC ACID 1000 MG/10ML IV SOLN
1000.0000 mg | Freq: Once | INTRAVENOUS | Status: AC
  Administered 2016-12-13: 1000 mg via INTRAVENOUS
  Filled 2016-12-13: qty 1100

## 2016-12-13 MED ORDER — HYDROMORPHONE HCL-NACL 0.5-0.9 MG/ML-% IV SOSY
PREFILLED_SYRINGE | INTRAVENOUS | Status: AC
  Administered 2016-12-13: 0.5 mg via INTRAVENOUS
  Filled 2016-12-13: qty 4

## 2016-12-13 MED ORDER — ONDANSETRON HCL 4 MG/2ML IJ SOLN
INTRAMUSCULAR | Status: AC
  Filled 2016-12-13: qty 2

## 2016-12-13 MED ORDER — BUPIVACAINE-EPINEPHRINE (PF) 0.25% -1:200000 IJ SOLN
INTRAMUSCULAR | Status: AC
  Filled 2016-12-13: qty 30

## 2016-12-13 MED ORDER — CEFAZOLIN SODIUM-DEXTROSE 2-4 GM/100ML-% IV SOLN
2.0000 g | Freq: Four times a day (QID) | INTRAVENOUS | Status: AC
  Administered 2016-12-13 – 2016-12-14 (×2): 2 g via INTRAVENOUS
  Filled 2016-12-13 (×2): qty 100

## 2016-12-13 MED ORDER — SENNA 8.6 MG PO TABS
2.0000 | ORAL_TABLET | Freq: Every day | ORAL | Status: DC
  Administered 2016-12-13 – 2016-12-14 (×2): 17.2 mg via ORAL
  Filled 2016-12-13 (×2): qty 2

## 2016-12-13 MED ORDER — METOCLOPRAMIDE HCL 5 MG/ML IJ SOLN: 5.0000 mg | Freq: Three times a day (TID) | INTRAMUSCULAR | Status: DC | PRN

## 2016-12-13 MED ORDER — ISOPROPYL ALCOHOL 70 % SOLN
Status: AC
  Filled 2016-12-13: qty 480

## 2016-12-13 SURGICAL SUPPLY — 57 items
BAG ZIPLOCK 12X15 (MISCELLANEOUS) ×3 IMPLANT
BANDAGE ACE 4X5 VEL STRL LF (GAUZE/BANDAGES/DRESSINGS) ×3 IMPLANT
BANDAGE ACE 6X5 VEL STRL LF (GAUZE/BANDAGES/DRESSINGS) ×3 IMPLANT
BLADE SAW RECIPROCATING 77.5 (BLADE) ×3 IMPLANT
BONE CEMENT SIMPLEX TOBRAMYCIN (Cement) ×4 IMPLANT
CAPT KNEE TRIATH TK-4 ×3 IMPLANT
CEMENT BONE SIMPLEX TOBRAMYCIN (Cement) ×2 IMPLANT
CHLORAPREP W/TINT 26ML (MISCELLANEOUS) ×6 IMPLANT
COVER SURGICAL LIGHT HANDLE (MISCELLANEOUS) ×3 IMPLANT
CUFF TOURN SGL QUICK 34 (TOURNIQUET CUFF) ×2
CUFF TRNQT CYL 34X4X40X1 (TOURNIQUET CUFF) ×1 IMPLANT
DECANTER SPIKE VIAL GLASS SM (MISCELLANEOUS) ×6 IMPLANT
DERMABOND ADVANCED (GAUZE/BANDAGES/DRESSINGS) ×4
DERMABOND ADVANCED .7 DNX12 (GAUZE/BANDAGES/DRESSINGS) ×2 IMPLANT
DRAPE SHEET LG 3/4 BI-LAMINATE (DRAPES) ×6 IMPLANT
DRAPE U-SHAPE 47X51 STRL (DRAPES) ×3 IMPLANT
DRSG AQUACEL AG ADV 3.5X10 (GAUZE/BANDAGES/DRESSINGS) ×3 IMPLANT
DRSG KUZMA FLUFF (GAUZE/BANDAGES/DRESSINGS) ×3 IMPLANT
DRSG TEGADERM 4X4.75 (GAUZE/BANDAGES/DRESSINGS) ×3 IMPLANT
ELECT BLADE TIP CTD 4 INCH (ELECTRODE) ×3 IMPLANT
ELECT REM PT RETURN 15FT ADLT (MISCELLANEOUS) ×3 IMPLANT
EVACUATOR 1/8 PVC DRAIN (DRAIN) IMPLANT
GAUZE SPONGE 4X4 12PLY STRL (GAUZE/BANDAGES/DRESSINGS) ×3 IMPLANT
GLOVE BIO SURGEON STRL SZ8.5 (GLOVE) ×6 IMPLANT
GLOVE BIOGEL PI IND STRL 8.5 (GLOVE) ×1 IMPLANT
GLOVE BIOGEL PI INDICATOR 8.5 (GLOVE) ×2
GOWN SPEC L3 XXLG W/TWL (GOWN DISPOSABLE) ×3 IMPLANT
HANDPIECE INTERPULSE COAX TIP (DISPOSABLE) ×2
HOOD PEEL AWAY FLYTE STAYCOOL (MISCELLANEOUS) ×6 IMPLANT
MARKER SKIN DUAL TIP RULER LAB (MISCELLANEOUS) ×3 IMPLANT
NEEDLE SPNL 18GX3.5 QUINCKE PK (NEEDLE) ×3 IMPLANT
NS IRRIG 1000ML POUR BTL (IV SOLUTION) ×3 IMPLANT
PACK TOTAL KNEE CUSTOM (KITS) ×3 IMPLANT
PADDING CAST COTTON 6X4 STRL (CAST SUPPLIES) ×3 IMPLANT
POSITIONER SURGICAL ARM (MISCELLANEOUS) ×3 IMPLANT
SAW OSC TIP CART 19.5X105X1.3 (SAW) ×3 IMPLANT
SEALER BIPOLAR AQUA 6.0 (INSTRUMENTS) ×3 IMPLANT
SET HNDPC FAN SPRY TIP SCT (DISPOSABLE) ×1 IMPLANT
SET PAD KNEE POSITIONER (MISCELLANEOUS) ×3 IMPLANT
SPONGE DRAIN TRACH 4X4 STRL 2S (GAUZE/BANDAGES/DRESSINGS) ×3 IMPLANT
SPONGE LAP 18X18 X RAY DECT (DISPOSABLE) IMPLANT
SUCTION FRAZIER HANDLE 12FR (TUBING) ×2
SUCTION TUBE FRAZIER 12FR DISP (TUBING) ×1 IMPLANT
SUT MNCRL AB 3-0 PS2 18 (SUTURE) ×3 IMPLANT
SUT MON AB 2-0 CT1 36 (SUTURE) ×6 IMPLANT
SUT STRATAFIX PDO 1 14 VIOLET (SUTURE) ×2
SUT STRATFX PDO 1 14 VIOLET (SUTURE) ×1
SUT VIC AB 1 CT1 36 (SUTURE) ×9 IMPLANT
SUT VIC AB 2-0 CT1 27 (SUTURE) ×2
SUT VIC AB 2-0 CT1 TAPERPNT 27 (SUTURE) ×1 IMPLANT
SUTURE STRATFX PDO 1 14 VIOLET (SUTURE) ×1 IMPLANT
SYR 50ML LL SCALE MARK (SYRINGE) ×3 IMPLANT
TOWER CARTRIDGE SMART MIX (DISPOSABLE) IMPLANT
TRAY FOLEY W/METER SILVER 16FR (SET/KITS/TRAYS/PACK) ×3 IMPLANT
WATER STERILE IRR 1000ML POUR (IV SOLUTION) ×6 IMPLANT
WRAP KNEE MAXI GEL POST OP (GAUZE/BANDAGES/DRESSINGS) ×3 IMPLANT
YANKAUER SUCT BULB TIP 10FT TU (MISCELLANEOUS) ×3 IMPLANT

## 2016-12-13 NOTE — Anesthesia Procedure Notes (Signed)
Anesthesia Regional Block: Adductor canal block   Pre-Anesthetic Checklist: ,, timeout performed, Correct Patient, Correct Site, Correct Laterality, Correct Procedure, Correct Position, site marked, Risks and benefits discussed,  Surgical consent,  Pre-op evaluation,  At surgeon's request and post-op pain management  Laterality: Right  Prep: chloraprep       Needles:   Needle Type: Stimulator Needle - 80          Additional Needles:   Procedures: Doppler guided, Ultrasound guided,,,,,,  Narrative:  Start time: 12/13/2016 12:00 PM End time: 12/13/2016 12:15 PM Injection made incrementally with aspirations every 5 mL.  Performed by: Personally  Anesthesiologist: Dorris Singh

## 2016-12-13 NOTE — Interval H&P Note (Signed)
History and Physical Interval Note:  12/13/2016 12:32 PM  Gerald Robles  has presented today for surgery, with the diagnosis of Degenerative joint disease, right knee  The various methods of treatment have been discussed with the patient and family. After consideration of risks, benefits and other options for treatment, the patient has consented to  Procedure(s) with comments: RIGHT TOTAL KNEE ARTHROPLASTY WITH COMPUTER NAVIGATION (Right) - Needs RNFA as a surgical intervention .  The patient's history has been reviewed, patient examined, no change in status, stable for surgery.  I have reviewed the patient's chart and labs.  Questions were answered to the patient's satisfaction.     Nika Yazzie, Cloyde Reams

## 2016-12-13 NOTE — Anesthesia Postprocedure Evaluation (Signed)
Anesthesia Post Note  Patient: Gerald Robles  Procedure(s) Performed: Procedure(s) (LRB): RIGHT TOTAL KNEE ARTHROPLASTY WITH COMPUTER NAVIGATION (Right)     Patient location during evaluation: PACU Anesthesia Type: General Level of consciousness: awake Pain management: pain level controlled Vital Signs Assessment: post-procedure vital signs reviewed and stable Respiratory status: spontaneous breathing Cardiovascular status: stable Postop Assessment: no signs of nausea or vomiting Anesthetic complications: no    Last Vitals:  Vitals:   12/13/16 1953 12/13/16 2111  BP: 124/68 133/72  Pulse: 89 77  Resp: 18 18  Temp: 36.9 C 36.7 C  SpO2: 99% 98%    Last Pain:  Vitals:   12/13/16 2111  TempSrc: Oral  PainSc:                  Raziel Koenigs

## 2016-12-13 NOTE — Op Note (Signed)
OPERATIVE REPORT  SURGEON: Samson Frederic, MD   ASSISTANT: Staff.  PREOPERATIVE DIAGNOSIS: Posttraumatic Right knee arthritis.   POSTOPERATIVE DIAGNOSIS: Posttraumatic Right knee arthritis.   PROCEDURE: Right total knee arthroplasty.   IMPLANTS: Stryker Triathlon CR femur, size 7. Stryker universal tibia, size 7. X3 polyethelyene insert, size 9 mm, CR. 3 button asymmetric patella, size 40 mm. Simplex P bone cement. Synthes 3.5 mm cortical bone screw.  ANESTHESIA:  General and Regional  TOURNIQUET TIME: 22 min at Hg.  ESTIMATED BLOOD LOSS: 1100 mL.  ANTIBIOTICS: 2 g Ancef.  DRAINS: Medium HV x1.  COMPLICATIONS: None   CONDITION: PACU - hemodynamically stable.   BRIEF CLINICAL NOTE: Gerald Robles is a 51 y.o. male with a long-standing history of posttraumatic Right knee arthritis. After failing conservative management, the patient was indicated for total knee arthroplasty. The risks, benefits, and alternatives to the procedure were explained, and the patient elected to proceed.  PROCEDURE IN DETAIL: Adductor canal block was obtained in the pre-op holding area. Once inside the operative room, a foley catheter was inserted. The patient was then positioned, a nonsterile tourniquet was placed, and the lower extremity was prepped and draped in the normal sterile surgical fashion. A time-out was called verifying side and site of surgery. The patient received IV antibiotics within 60 minutes of beginning the procedure.   An anterior approach to the knee was performed utilizing a mid vastus arthrotomy. A medial release was performed and the patellar fat pad was excised. The anterior cruciate ligament was absent. Stryker navigation was used to cut the distal femur perpendicular to the mechanical axis. A freehand patellar resection was performed, and the patella was sized an prepared  with 3 lug holes.  Nagivation was used to make a neutral proximal tibia resection, taking 9 mm of bone from the less affected lateral side with 3 degrees of slope. The menisci were excised. A spacer block was placed, and the alignment and balance in extension were confirmed.   The distal femur was sized using the 3-degree external rotation guide referencing the posterior femoral cortex. The appropriate 4-in-1 cutting block was pinned into place. Rotation was checked using Whiteside's line, the epicondylar axis, and then confirmed with a spacer block in flexion. The remaining femoral cuts were performed, taking care to protect the MCL.  The tibia was sized and the trial tray was pinned into place. The remaining trail components were inserted. The knee was stable to varus and valgus stress through a full range of motion. The patella tracked centrally, and the PCL was well balanced. The trial components were removed, and the proximal tibial surface was prepared. I identified and removed the tibial interference screw, as it was in the way of preparing the proximal tibia. He did have a noncontained defect to the posterior medial tibia, less than 20% of the contact surface of the tay. I elected to place a single 3.5 mm cancellus screw to reinforce the posterior medial aspect of the tray. The extremity was exsanguinated with an Esmarch, and the tourniquet was inflated. Small drill holes were made in the sclerotic subchondral bone.The cut bony surfaces were irrigated with pulse lavage. Final tibial and patellarwere cemented into place and excess cement was cleared.the femoral component was press-fit. The trial insert was placed, and the knee was brought into extension while the cement polymerized. Once the cement was hard, the knee was tested for a final time and found to be well balanced. The trial insert was exchanged for the  real polyethylene insert.  The wound was copiously irrigated with normal saline using  pulse lavage. Marcaine solution was injected into the periarticular soft tissue. The wound was closed in layers using #1 Vicryl and strata fix for the fascia, 2-0 Vicryl for the subcutaneous fat, 2-0 Monocryl for the deep dermal layer, 3-0 running Monocryl subcuticular Stitch, and Dermabond for the skin. Once the glue was fully dried, an Aquacell Ag and compressive dressing were applied. The tourniquet was let down, and the patient was transported to the recovery room in stable ondition. Sponge, needle, and instrument counts were correct at the end of the case x2. The patient tolerated the procedure well and there were no known complications.

## 2016-12-13 NOTE — Progress Notes (Signed)
AssistedDr. Edwards with right, ultrasound guided, adductor canal block. Side rails up, monitors on throughout procedure. See vital signs in flow sheet. Tolerated Procedure well.  

## 2016-12-13 NOTE — Anesthesia Procedure Notes (Addendum)
Procedure Name: Intubation Date/Time: 12/13/2016 12:53 PM Performed by: Illene Silver Pre-anesthesia Checklist: Patient identified, Emergency Drugs available, Suction available and Patient being monitored Patient Re-evaluated:Patient Re-evaluated prior to induction Oxygen Delivery Method: Circle system utilized Preoxygenation: Pre-oxygenation with 100% oxygen Induction Type: IV induction Ventilation: Mask ventilation without difficulty Laryngoscope Size: Miller and 3 Grade View: Grade I Tube type: Oral Tube size: 8.0 mm Number of attempts: 1 Airway Equipment and Method: Stylet Placement Confirmation: ETT inserted through vocal cords under direct vision,  positive ETCO2 and breath sounds checked- equal and bilateral Secured at: 23 cm Tube secured with: Tape Dental Injury: Teeth and Oropharynx as per pre-operative assessment

## 2016-12-13 NOTE — Discharge Instructions (Signed)
° °Dr. Eddye Broxterman °Total Joint Specialist °[facility] °[address] °[place], [place] [address] °[phone] ° °TOTAL KNEE REPLACEMENT POSTOPERATIVE DIRECTIONS ° ° ° °Knee Rehabilitation, Guidelines Following Surgery  °Results after knee surgery are often greatly improved when you follow the exercise, range of motion and muscle strengthening exercises prescribed by your doctor. Safety measures are also important to protect the knee from further injury. Any time any of these exercises cause you to have increased pain or swelling in your knee joint, decrease the amount until you are comfortable again and slowly increase them. If you have problems or questions, call your caregiver or physical therapist for advice.  ° °WEIGHT BEARING °Weight bearing as tolerated with assist device (walker, cane, etc) as directed, use it as long as suggested by your surgeon or therapist, typically at least 4-6 weeks. ° °HOME CARE INSTRUCTIONS  °Remove items at home which could result in a fall. This includes throw rugs or furniture in walking pathways.  °Continue medications as instructed at time of discharge. °You may have some home medications which will be placed on hold until you complete the course of blood thinner medication.  °You may start showering once you are discharged home but do not submerge the incision under water. Just pat the incision dry and apply a dry gauze dressing on daily. °Walk with walker as instructed.  °You may resume a sexual relationship in one month or when given the OK by your doctor.  °· Use walker as long as suggested by your caregivers. °· Avoid periods of inactivity such as sitting longer than an hour when not asleep. This helps prevent blood clots.  °You may put full weight on your legs and walk as much as is comfortable.  °You may return to work once you are cleared by your doctor.  °Do not drive a car for 6 weeks or until released by you surgeon.  °· Do not drive  while taking narcotics.  °Wear the elastic stockings for three weeks following surgery during the day but you may remove then at night. °Make sure you keep all of your appointments after your operation with all of your doctors and caregivers. You should call the office at the above phone number and make an appointment for approximately two weeks after the date of your surgery. °Do not remove your surgical dressing. The dressing is waterproof; you may take showers in 3 days, but do not take tub baths or submerge the dressing. °Please pick up a stool softener and laxative for home use as long as you are requiring pain medications. °· ICE to the affected knee every three hours for 30 minutes at a time and then as needed for pain and swelling.  Continue to use ice on the knee for pain and swelling from surgery. You may notice swelling that will progress down to the foot and ankle.  This is normal after surgery.  Elevate the leg when you are not up walking on it.   °It is important for you to complete the blood thinner medication as prescribed by your doctor. °· Continue to use the breathing machine which will help keep your temperature down.  It is common for your temperature to cycle up and down following surgery, especially at night when you are not up moving around and exerting yourself.  The breathing machine keeps your lungs expanded and your temperature down. ° °RANGE OF MOTION AND STRENGTHENING EXERCISES  °Rehabilitation of the knee is important following   a knee injury or an operation. After just a few days of immobilization, the muscles of the thigh which control the knee become weakened and shrink (atrophy). Knee exercises are designed to build up the tone and strength of the thigh muscles and to improve knee motion. Often times heat used for twenty to thirty minutes before working out will loosen up your tissues and help with improving the range of motion but do not use heat for the first two weeks following  surgery. These exercises can be done on a training (exercise) mat, on the floor, on a table or on a bed. Use what ever works the best and is most comfortable for you Knee exercises include:  °Leg Lifts - While your knee is still immobilized in a splint or cast, you can do straight leg raises. Lift the leg to 60 degrees, hold for 3 sec, and slowly lower the leg. Repeat 10-20 times 2-3 times daily. Perform this exercise against resistance later as your knee gets better.  °Quad and Hamstring Sets - Tighten up the muscle on the front of the thigh (Quad) and hold for 5-10 sec. Repeat this 10-20 times hourly. Hamstring sets are done by pushing the foot backward against an object and holding for 5-10 sec. Repeat as with quad sets.  °A rehabilitation program following serious knee injuries can speed recovery and prevent re-injury in the future due to weakened muscles. Contact your doctor or a physical therapist for more information on knee rehabilitation.  ° °SKILLED REHAB INSTRUCTIONS: °If the patient is transferred to a skilled rehab facility following release from the hospital, a list of the current medications will be sent to the facility for the patient to continue.  When discharged from the skilled rehab facility, please have the facility set up the patient's Home Health Physical Therapy prior to being released. Also, the skilled facility will be responsible for providing the patient with their medications at time of release from the facility to include their pain medication, the muscle relaxants, and their blood thinner medication. If the patient is still at the rehab facility at time of the two week follow up appointment, the skilled rehab facility will also need to assist the patient in arranging follow up appointment in our office and any transportation needs. ° °MAKE SURE YOU:  °Understand these instructions.  °Will watch your condition.  °Will get help right away if you are not doing well or get worse.   ° ° °Pick up stool softner and laxative for home use following surgery while on pain medications. °Do NOT remove your dressing. You may shower.  °Do not take tub baths or submerge incision under water. °May shower starting three days after surgery. °Please use a clean towel to pat the incision dry following showers. °Continue to use ice for pain and swelling after surgery. °Do not use any lotions or creams on the incision until instructed by your surgeon. ° °

## 2016-12-13 NOTE — H&P (View-Only) (Signed)
TOTAL KNEE ADMISSION H&P  Patient is being admitted for right total knee arthroplasty.  Subjective:  Chief Complaint:right knee pain.  HPI: Gerald Robles, 51 y.o. male, has a history of pain and functional disability in the right knee due to trauma and has failed non-surgical conservative treatments for greater than 12 weeks to includeNSAID's and/or analgesics, corticosteriod injections, flexibility and strengthening excercises, supervised PT with diminished ADL's post treatment, use of assistive devices, weight reduction as appropriate and activity modification.  Onset of symptoms was gradual, starting >10 years ago with gradually worsening course since that time. The patient noted prior procedures on the knee to include  ACL reconstruction on the bilaterally knee(s).  Patient currently rates pain in the right knee(s) at 10 out of 10 with activity. Patient has night pain, worsening of pain with activity and weight bearing, pain that interferes with activities of daily living, pain with passive range of motion, crepitus and joint swelling.  Patient has evidence of subchondral cysts, subchondral sclerosis, periarticular osteophytes, joint subluxation, joint space narrowing and retained hardware by imaging studies. There is no active infection.  Patient Active Problem List   Diagnosis Date Noted  . Osteoarthritis of left knee 08/09/2016  . Paroxysmal atrial fibrillation (HCC) 05/04/2015   Past Medical History:  Diagnosis Date  . A-fib (HCC)   . Anemia   . Anemia   . Arthritis   . ED (erectile dysfunction)   . Hypertension   . Sarcoidosis 2003    Past Surgical History:  Procedure Laterality Date  . KNEE ARTHROPLASTY Left 08/09/2016   Procedure: LEFT TOTAL KNEE ARTHROPLASTY WITH COMPUTER NAVIGATION;  Surgeon: Samson Frederic, MD;  Location: [facility];  Service: Orthopedics;  Laterality: Left;  Needs RNFA  . KNEE SURGERY Bilateral 1990   right acl with arthsroscopy; left kee artshroscopy   .  SHOULDER SURGERY Right    twice  . VASECTOMY       (Not in a hospital admission) No Known Allergies  Social History  Substance Use Topics  . Smoking status: Never Smoker  . Smokeless tobacco: Never Used  . Alcohol use 24.6 oz/week    41 Cans of beer per week    Family History  Problem Relation Age of Onset  . Hypertension Mother      Review of Systems  Constitutional: Negative.   HENT: Positive for hearing loss.   Eyes: Negative.   Respiratory: Negative.   Cardiovascular: Negative.   Gastrointestinal: Negative.   Genitourinary: Negative.   Musculoskeletal: Positive for back pain, joint pain and myalgias.  Skin: Negative.   Neurological: Negative.   Endo/Heme/Allergies: Negative.   Psychiatric/Behavioral: Negative.     Objective:  Physical Exam  Vitals reviewed. Constitutional: He is oriented to person, place, and time. He appears well-developed and well-nourished.  HENT:  Head: Normocephalic and atraumatic.  Eyes: Pupils are equal, round, and reactive to light. Conjunctivae and EOM are normal.  Neck: Normal range of motion. Neck supple.  Cardiovascular: Normal rate, regular rhythm and intact distal pulses.   Respiratory: Effort normal. No respiratory distress.  GI: Soft. He exhibits no distension.  Genitourinary:  Genitourinary Comments: deferred  Musculoskeletal:       Legs: Neurological: He is alert and oriented to person, place, and time. He has normal reflexes.  Skin: Skin is warm and dry.  Psychiatric: He has a normal mood and affect. His behavior is normal. Judgment and thought content normal.    Vital signs in last 24 hours: @VSRANGES @  Labs:  Estimated body mass index is 33.37 kg/m as calculated from the following:   Height as of 08/09/16: 6\' 3"  (1.905 m).   Weight as of 08/09/16: 121.1 kg (267 lb).   Imaging Review Plain radiographs demonstrate severe degenerative joint disease of the right knee(s). The overall alignment issignificant varus.  The bone quality appears to be adequate for age and reported activity level.  Assessment/Plan:  End stage arthritis, right knee   The patient history, physical examination, clinical judgment of the provider and imaging studies are consistent with end stage degenerative joint disease of the right knee(s) and total knee arthroplasty is deemed medically necessary. The treatment options including medical management, injection therapy arthroscopy and arthroplasty were discussed at length. The risks and benefits of total knee arthroplasty were presented and reviewed. The risks due to aseptic loosening, infection, stiffness, patella tracking problems, thromboembolic complications and other imponderables were discussed. The patient acknowledged the explanation, agreed to proceed with the plan and consent was signed. Patient is being admitted for inpatient treatment for surgery, pain control, PT, OT, prophylactic antibiotics, VTE prophylaxis, progressive ambulation and ADL's and discharge planning. The patient is planning to be discharged home with outpatient PT

## 2016-12-13 NOTE — Transfer of Care (Signed)
Immediate Anesthesia Transfer of Care Note  Patient: Gerald Robles  Procedure(s) Performed: Procedure(s) with comments: RIGHT TOTAL KNEE ARTHROPLASTY WITH COMPUTER NAVIGATION (Right) - Needs RNFA  Patient Location: PACU  Anesthesia Type:General  Level of Consciousness: awake, alert , oriented and patient cooperative  Airway & Oxygen Therapy: Patient Spontanous Breathing and Patient connected to face mask oxygen  Post-op Assessment: Report given to RN, Post -op Vital signs reviewed and stable and Patient moving all extremities X 4  Post vital signs: stable  Last Vitals:  Vitals:   12/13/16 1225 12/13/16 1230  BP: 121/76   Pulse: 64 69  Resp: 10 10  Temp:    SpO2: 98% 99%    Last Pain:  Vitals:   12/13/16 1054  TempSrc:   PainSc: 7       Patients Stated Pain Goal: 4 (12/13/16 1054)  Complications: No apparent anesthesia complications

## 2016-12-13 NOTE — Anesthesia Preprocedure Evaluation (Signed)
Anesthesia Evaluation  Patient identified by MRN, date of birth, ID band Patient awake    Reviewed: Allergy & Precautions, NPO status , Patient's Chart, lab work & pertinent test results  Airway Mallampati: II  TM Distance: >3 FB     Dental   Pulmonary    breath sounds clear to auscultation       Cardiovascular hypertension,  Rhythm:Regular Rate:Normal     Neuro/Psych negative neurological ROS     GI/Hepatic negative GI ROS, Neg liver ROS,   Endo/Other  negative endocrine ROS  Renal/GU negative Renal ROS     Musculoskeletal  (+) Arthritis ,   Abdominal   Peds  Hematology  (+) anemia ,   Anesthesia Other Findings   Reproductive/Obstetrics                             Anesthesia Physical Anesthesia Plan  ASA: III  Anesthesia Plan: General   Post-op Pain Management:  Regional for Post-op pain   Induction: Intravenous  PONV Risk Score and Plan: 2 and Ondansetron, Dexamethasone and Propofol infusion  Airway Management Planned: Oral ETT  Additional Equipment:   Intra-op Plan:   Post-operative Plan: Extubation in OR  Informed Consent: I have reviewed the patients History and Physical, chart, labs and discussed the procedure including the risks, benefits and alternatives for the proposed anesthesia with the patient or authorized representative who has indicated his/her understanding and acceptance.   Dental advisory given  Plan Discussed with: CRNA and Anesthesiologist  Anesthesia Plan Comments:         Anesthesia Quick Evaluation

## 2016-12-14 ENCOUNTER — Encounter (HOSPITAL_COMMUNITY): Payer: Self-pay | Admitting: Orthopedic Surgery

## 2016-12-14 LAB — CBC
HCT: 24.8 % — ABNORMAL LOW (ref 39.0–52.0)
Hemoglobin: 8.4 g/dL — ABNORMAL LOW (ref 13.0–17.0)
MCH: 29.3 pg (ref 26.0–34.0)
MCHC: 33.9 g/dL (ref 30.0–36.0)
MCV: 86.4 fL (ref 78.0–100.0)
Platelets: 239 10*3/uL (ref 150–400)
RBC: 2.87 MIL/uL — ABNORMAL LOW (ref 4.22–5.81)
RDW: 13.4 % (ref 11.5–15.5)
WBC: 8.5 10*3/uL (ref 4.0–10.5)

## 2016-12-14 LAB — BASIC METABOLIC PANEL
Anion gap: 6 (ref 5–15)
BUN: 8 mg/dL (ref 6–20)
CALCIUM: 8.3 mg/dL — AB (ref 8.9–10.3)
CHLORIDE: 103 mmol/L (ref 101–111)
CO2: 29 mmol/L (ref 22–32)
CREATININE: 0.72 mg/dL (ref 0.61–1.24)
GFR calc non Af Amer: >60 mL/min (ref >60)
Glucose, Bld: 147 mg/dL — ABNORMAL HIGH (ref 65–99)
Potassium: 4.2 mmol/L (ref 3.5–5.1)
SODIUM: 138 mmol/L (ref 135–145)

## 2016-12-14 MED ORDER — OXYCODONE HCL ER 10 MG PO T12A: 10.0000 mg | EXTENDED_RELEASE_TABLET | ORAL | 0 refills | Status: DC

## 2016-12-14 MED ORDER — SENNA 8.6 MG PO TABS: 2.0000 | ORAL_TABLET | Freq: Every day | ORAL | 0 refills | Status: DC

## 2016-12-14 MED ORDER — DOCUSATE SODIUM 100 MG PO CAPS: 100.0000 mg | ORAL_CAPSULE | Freq: Two times a day (BID) | ORAL | 1 refills | Status: DC

## 2016-12-14 MED ORDER — OXYCODONE-ACETAMINOPHEN 5-325 MG PO TABS: 1.0000 | ORAL_TABLET | ORAL | 0 refills | Status: DC | PRN

## 2016-12-14 MED ORDER — ASPIRIN 81 MG PO CHEW: 81.0000 mg | CHEWABLE_TABLET | Freq: Two times a day (BID) | ORAL | 1 refills | Status: DC

## 2016-12-14 MED ORDER — OXYCODONE-ACETAMINOPHEN 5-325 MG PO TABS
1.0000 | ORAL_TABLET | ORAL | Status: DC | PRN
  Administered 2016-12-14 – 2016-12-15 (×5): 2 via ORAL
  Filled 2016-12-14 (×5): qty 2

## 2016-12-14 MED ORDER — OXYCODONE HCL ER 10 MG PO T12A
10.0000 mg | EXTENDED_RELEASE_TABLET | Freq: Two times a day (BID) | ORAL | Status: DC
  Administered 2016-12-14 – 2016-12-15 (×3): 10 mg via ORAL
  Filled 2016-12-14 (×3): qty 1

## 2016-12-14 MED ORDER — ONDANSETRON HCL 4 MG PO TABS: 4.0000 mg | ORAL_TABLET | Freq: Four times a day (QID) | ORAL | 0 refills | Status: DC | PRN

## 2016-12-14 NOTE — Progress Notes (Signed)
Discharge planning, spoke with patient at bedside. Have chosen [facility] for HH PT. Contacted [facility] for referral. Has RW and 3n1 from previous surgery. [phone] 

## 2016-12-14 NOTE — Discharge Summary (Signed)
Physician Discharge Summary  Patient ID: Gerald Robles MRN: [id number] DOB/AGE: 1965-05-18 51 y.o.  Admit date: 12/13/2016 Discharge date: 12/14/2016  Admission Diagnoses:  Post-traumatic osteoarthritis of right knee  Discharge Diagnoses:  Principal Problem:   Post-traumatic osteoarthritis of right knee   Past Medical History:  Diagnosis Date  . A-fib (HCC)   . Anemia   . Anemia   . Arthritis   . ED (erectile dysfunction)   . Hypertension   . Sarcoidosis 2003    Surgeries: Procedure(s): RIGHT TOTAL KNEE ARTHROPLASTY WITH COMPUTER NAVIGATION on 12/13/2016   Consultants (if any):   Discharged Condition: Improved  Hospital Course: Gerald Robles is an 51 y.o. male who was admitted 12/13/2016 with a diagnosis of Post-traumatic osteoarthritis of right knee and went to the operating room on 12/13/2016 and underwent the above named procedures.    He was given perioperative antibiotics:  Anti-infectives    Start     Dose/Rate Route Frequency Ordered Stop   12/13/16 2000  ceFAZolin (ANCEF) IVPB 2g/100 mL premix     2 g 200 mL/hr over 30 Minutes Intravenous Every 6 hours 12/13/16 1820 12/14/16 0215   12/13/16 1030  ceFAZolin (ANCEF) IVPB 2g/100 mL premix     2 g 200 mL/hr over 30 Minutes Intravenous On call to O.R. 12/13/16 1030 12/13/16 1256    .  He was given sequential compression devices, early ambulation, and ASA for DVT prophylaxis.  He benefited maximally from the hospital stay and there were no complications.    Recent vital signs:  Vitals:   12/14/16 0130 12/14/16 0516  BP: (!) 118/55 128/63  Pulse: 75 74  Resp: 17 18  Temp: 98.1 F (36.7 C) 98.4 F (36.9 C)  SpO2: 98% 99%    Recent laboratory studies:  Lab Results  Component Value Date   HGB 8.4 (L) 12/14/2016   HGB 9.9 (L) 12/13/2016   HGB 12.2 (L) 12/06/2016   Lab Results  Component Value Date   WBC 8.5 12/14/2016   PLT 239 12/14/2016   No results found for: INR Lab Results  Component Value Date    NA 141 12/13/2016   K 3.9 12/13/2016   CL 105 12/06/2016   CO2 26 12/06/2016   BUN 7 12/06/2016   CREATININE 0.74 12/06/2016   GLUCOSE 104 (H) 12/13/2016    Discharge Medications:   Allergies as of 12/14/2016   No Known Allergies     Medication List    STOP taking these medications   aspirin EC 81 MG tablet Replaced by:  aspirin 81 MG chewable tablet     TAKE these medications   aspirin 81 MG chewable tablet Chew 1 tablet (81 mg total) by mouth 2 (two) times daily. Replaces:  aspirin EC 81 MG tablet   Diclofenac-Misoprostol 75-0.2 MG Tbec Take 1 tablet by mouth daily.   docusate sodium 100 MG capsule Commonly known as:  COLACE Take 1 capsule (100 mg total) by mouth 2 (two) times daily.   Fish Oil 1000 MG Caps Take 2 capsules by mouth daily.   losartan 50 MG tablet Commonly known as:  COZAAR Take 50 mg by mouth daily.   ondansetron 4 MG tablet Commonly known as:  ZOFRAN Take 1 tablet (4 mg total) by mouth every 6 (six) hours as needed for nausea.   oxyCODONE 10 mg 12 hr tablet Commonly known as:  OXYCONTIN Take 1 tablet (10 mg total) by mouth PRO. 1 tab PO every 12 hours for 3 days, then  1 tab PO daily for 4 days   oxyCODONE-acetaminophen 5-325 MG tablet Commonly known as:  PERCOCET/ROXICET Take 1-2 tablets by mouth every 4 (four) hours as needed for severe pain.   senna 8.6 MG Tabs tablet Commonly known as:  SENOKOT Take 2 tablets (17.2 mg total) by mouth at bedtime.       Diagnostic Studies: Dg Knee Right Port  Result Date: 12/13/2016 CLINICAL DATA:  Status post right knee arthroplasty. EXAM: PORTABLE RIGHT KNEE - 1-2 VIEW COMPARISON:  None. FINDINGS: Two views demonstrate normal alignment following total right knee arthroplasty. No abnormal lucency is seen surrounding hardware. IMPRESSION: Normal alignment of right knee arthroplasty. Electronically Signed   By: Irish Lack M.D.   On: 12/13/2016 17:11    Disposition: 01-Home or Self  Care    Follow-up Information    Saleem Coccia, Arlys John, MD. Schedule an appointment as soon as possible for a visit in 2 weeks.   Specialty:  Orthopedic Surgery Why:  For wound re-check Contact information: [address] [place] [address] [phone]            Signed: Garnet Koyanagi 12/14/2016, 7:43 AM

## 2016-12-14 NOTE — Progress Notes (Signed)
Physical Therapy Evaluation Patient Details Name: Gerald Robles MRN: [id number] DOB: 1965/12/19 Today's Date: 12/14/2016   History of Present Illness  Pt s/p R TKR and with recent hx of L TKR  Clinical Impression  Pt s/p R TKR and presents with decreased R LE strength/ROM and post op pain limiting functional mobility.  Pt should progress well to dc home with PRN assist of family/friends.    Follow Up Recommendations Home health PT    Equipment Recommendations  None recommended by PT    Recommendations for Other Services       Precautions / Restrictions Precautions Precautions: Knee;Fall Restrictions Weight Bearing Restrictions: No Other Position/Activity Restrictions: WBAT      Mobility  Bed Mobility Overal bed mobility: Needs Assistance Bed Mobility: Supine to Sit     Supine to sit: Min guard     General bed mobility comments: cues for sequence and use of L LE to self assist  Transfers Overall transfer level: Needs assistance Equipment used: Rolling walker (2 wheeled) Transfers: Sit to/from Stand Sit to Stand: Min guard         General transfer comment: cues for LE management and use of UEs to self assist  Ambulation/Gait Ambulation/Gait assistance: Min assist;Min guard Ambulation Distance (Feet): 100 Feet Assistive device: Rolling walker (2 wheeled) Gait Pattern/deviations: Decreased step length - right;Decreased step length - left;Shuffle;Step-to pattern;Step-through pattern;Trunk flexed Gait velocity: decr Gait velocity interpretation: Below normal speed for age/gender General Gait Details: cues for posture, position from RW and initial sequence  Stairs            Wheelchair Mobility    Modified Rankin (Stroke Patients Only)       Balance Overall balance assessment: No apparent balance deficits (not formally assessed)                                           Pertinent Vitals/Pain Pain Assessment: 0-10 Pain Score: 6   Pain Location: R knee Pain Descriptors / Indicators: Aching;Sore Pain Intervention(s): Limited activity within patient's tolerance;Monitored during session;Premedicated before session;Ice applied    Home Living Family/patient expects to be discharged to:: Private residence Living Arrangements: Alone Available Help at Discharge: Friend(s);Available PRN/intermittently Type of Home: House Home Access: Stairs to enter Entrance Stairs-Rails: Right Entrance Stairs-Number of Steps: 4 Home Layout: One level Home Equipment: Walker - 2 wheels;Walker - standard;Cane - single point;Crutches Additional Comments: pt is borrowing a 3:1 commode    Prior Function Level of Independence: Independent               Hand Dominance        Extremity/Trunk Assessment   Upper Extremity Assessment Upper Extremity Assessment: Overall WFL for tasks assessed    Lower Extremity Assessment Lower Extremity Assessment: RLE deficits/detail RLE Deficits / Details: 3/5 quads with IND SLR and AAROM at knee -10 - 90    Cervical / Trunk Assessment Cervical / Trunk Assessment: Normal  Communication   Communication: No difficulties  Cognition Arousal/Alertness: Awake/alert Behavior During Therapy: WFL for tasks assessed/performed Overall Cognitive Status: Within Functional Limits for tasks assessed                                        General Comments      Exercises Total Joint Exercises Ankle  Circles/Pumps: AROM;Both;15 reps;Supine Quad Sets: AROM;Both;10 reps;Supine Heel Slides: AAROM;Right;15 reps;Supine Straight Leg Raises: AAROM;AROM;Right;10 reps;Supine   Assessment/Plan    PT Assessment Patient needs continued PT services  PT Problem List Decreased strength;Decreased range of motion;Decreased activity tolerance;Decreased mobility;Pain       PT Treatment Interventions DME instruction;Gait training;Stair training;Functional mobility training;Therapeutic  activities;Therapeutic exercise;Patient/family education    PT Goals (Current goals can be found in the Care Plan section)  Acute Rehab PT Goals Patient Stated Goal: Regain IND and get back to work PT Goal Formulation: With patient Time For Goal Achievement: 12/17/16 Potential to Achieve Goals: Good    Frequency 7X/week   Barriers to discharge        Co-evaluation               AM-PAC PT "6 Clicks" Daily Activity  Outcome Measure Difficulty turning over in bed (including adjusting bedclothes, sheets and blankets)?: A Little Difficulty moving from lying on back to sitting on the side of the bed? : A Little Difficulty sitting down on and standing up from a chair with arms (e.g., wheelchair, bedside commode, etc,.)?: A Little Help needed moving to and from a bed to chair (including a wheelchair)?: A Little Help needed walking in hospital room?: A Little Help needed climbing 3-5 steps with a railing? : A Little 6 Click Score: 18    End of Session Equipment Utilized During Treatment: Gait belt Activity Tolerance: Patient tolerated treatment well Patient left: in chair;with call bell/phone within reach Nurse Communication: Mobility status PT Visit Diagnosis: Difficulty in walking, not elsewhere classified (R26.2)    Time: 5364-6803 PT Time Calculation (min) (ACUTE ONLY): 30 min   Charges:   PT Evaluation $PT Eval Low Complexity: 1 Low PT Treatments $Therapeutic Exercise: 8-22 mins   PT G Codes:        Pg [phone]   Senaida Chilcote 12/14/2016, 12:04 PM

## 2016-12-14 NOTE — Progress Notes (Signed)
OT Cancellation Note  Patient Details Name: Gerald Robles MRN: [id number] DOB: April 22, 1966   Cancelled Treatment:    Reason Eval/Treat Not Completed: PT screened, no needs identified, will sign off  Sakura Denis 12/14/2016, 8:59 AM  Marica Otter, OTR/L [phone] 12/14/2016

## 2016-12-14 NOTE — Progress Notes (Signed)
   Subjective:  Patient reports pain as moderate to severe.  C/o right knee pain - not controlled with norco. Denies N/V/CP/SOB.  Objective:   VITALS:   Vitals:   12/13/16 2111 12/13/16 2229 12/14/16 0130 12/14/16 0516  BP: 133/72 (!) 122/59 (!) 118/55 128/63  Pulse: 77 77 75 74  Resp: 18 17 17 18   Temp: 98.1 F (36.7 C) 98 F (36.7 C) 98.1 F (36.7 C) 98.4 F (36.9 C)  TempSrc: Oral Oral Oral Oral  SpO2: 98% 98% 98% 99%  Weight:      Height:        NAD ABD soft Sensation intact distally Intact pulses distally Dorsiflexion/Plantar flexion intact Incision: dressing C/D/I Compartment soft  HV ss   Lab Results  Component Value Date   WBC 8.5 12/14/2016   HGB 8.4 (L) 12/14/2016   HCT 24.8 (L) 12/14/2016   MCV 86.4 12/14/2016   PLT 239 12/14/2016   BMET    Component Value Date/Time   NA 141 12/13/2016 1417   K 3.9 12/13/2016 1417   CL 105 12/06/2016 1330   CO2 26 12/06/2016 1330   GLUCOSE 104 (H) 12/13/2016 1417   BUN 7 12/06/2016 1330   CREATININE 0.74 12/06/2016 1330   CALCIUM 9.0 12/06/2016 1330   GFRNONAA >60 12/06/2016 1330   GFRAA >60 12/06/2016 1330     Assessment/Plan: 1 Day Post-Op   Principal Problem:   Post-traumatic osteoarthritis of right knee   WBAT with walker DVT ppx: ASA, SCDs, TEDS PO pain control: change norco to percocet, add oxycontin D/C HV drain ABLA: asymptomatic, monitor, transfuise hgb < 7 Dispo: check hgb in am, likely D/C home tomorrow with HHPT   Maylon Sailors, Cloyde Reams 12/14/2016, 7:38 AM   Samson Frederic, MD Cell [phone]

## 2016-12-14 NOTE — Progress Notes (Signed)
Physical Therapy Treatment Patient Details Name: Gerald Robles MRN: [id number] DOB: January 27, 1966 Today's Date: 12/14/2016    History of Present Illness Pt s/p R TKR and with recent hx of L TKR    PT Comments    Pt progressing extremely well with mobility and hopeful for return home tomorrow.   Follow Up Recommendations  Home health PT     Equipment Recommendations  None recommended by PT    Recommendations for Other Services       Precautions / Restrictions Precautions Precautions: Knee;Fall Restrictions Weight Bearing Restrictions: No Other Position/Activity Restrictions: WBAT    Mobility  Bed Mobility Overal bed mobility: Needs Assistance Bed Mobility: Supine to Sit;Sit to Supine     Supine to sit: Supervision Sit to supine: Supervision   General bed mobility comments: cues for sequence and use of L LE to self assist  Transfers Overall transfer level: Needs assistance Equipment used: Rolling walker (2 wheeled) Transfers: Sit to/from Stand Sit to Stand: Supervision         General transfer comment: cues for LE management and use of UEs to self assist  Ambulation/Gait Ambulation/Gait assistance: Min guard;Supervision Ambulation Distance (Feet): 380 Feet Assistive device: Rolling walker (2 wheeled) Gait Pattern/deviations: Step-to pattern;Step-through pattern;Shuffle;Decreased step length - right;Decreased step length - left;Trunk flexed     General Gait Details: min cues for posture, position from RW and initial sequence   Stairs Stairs: Yes   Stair Management: One rail Left;Step to pattern;Forwards;With crutches Number of Stairs: 8 General stair comments: min cues for sequence and foot/crutch placement  Wheelchair Mobility    Modified Rankin (Stroke Patients Only)       Balance                                            Cognition Arousal/Alertness: Awake/alert Behavior During Therapy: WFL for tasks  assessed/performed Overall Cognitive Status: Within Functional Limits for tasks assessed                                        Exercises      General Comments        Pertinent Vitals/Pain Pain Assessment: 0-10 Pain Score: 4  Pain Location: R knee Pain Descriptors / Indicators: Aching;Sore Pain Intervention(s): Limited activity within patient's tolerance;Monitored during session;Premedicated before session;Ice applied    Home Living                      Prior Function            PT Goals (current goals can now be found in the care plan section) Acute Rehab PT Goals Patient Stated Goal: Regain IND and get back to work PT Goal Formulation: With patient Time For Goal Achievement: 12/17/16 Potential to Achieve Goals: Good Progress towards PT goals: Progressing toward goals    Frequency    7X/week      PT Plan Current plan remains appropriate    Co-evaluation              AM-PAC PT "6 Clicks" Daily Activity  Outcome Measure  Difficulty turning over in bed (including adjusting bedclothes, sheets and blankets)?: A Little Difficulty moving from lying on back to sitting on the side of the bed? : A Little Difficulty sitting  down on and standing up from a chair with arms (e.g., wheelchair, bedside commode, etc,.)?: A Little Help needed moving to and from a bed to chair (including a wheelchair)?: A Little Help needed walking in hospital room?: A Little Help needed climbing 3-5 steps with a railing? : A Little 6 Click Score: 18    End of Session Equipment Utilized During Treatment: Gait belt Activity Tolerance: Patient tolerated treatment well Patient left: in bed;with call bell/phone within reach Nurse Communication: Mobility status PT Visit Diagnosis: Difficulty in walking, not elsewhere classified (R26.2)     Time: 1550-1620 PT Time Calculation (min) (ACUTE ONLY): 30 min  Charges:  $Gait Training: 23-37 mins                     G Codes:       Pg [phone]    Tiffaney Heimann 12/14/2016, 5:31 PM

## 2016-12-15 LAB — CBC
HEMATOCRIT: 23.8 % — AB (ref 39.0–52.0)
HEMOGLOBIN: 8 g/dL — AB (ref 13.0–17.0)
MCH: 29.5 pg (ref 26.0–34.0)
MCHC: 33.6 g/dL (ref 30.0–36.0)
MCV: 87.8 fL (ref 78.0–100.0)
Platelets: 212 10*3/uL (ref 150–400)
RBC: 2.71 MIL/uL — AB (ref 4.22–5.81)
RDW: 13.8 % (ref 11.5–15.5)
WBC: 7.4 10*3/uL (ref 4.0–10.5)

## 2016-12-15 NOTE — Progress Notes (Signed)
Physical Therapy Treatment Patient Details Name: Gerald Robles MRN: [id number] DOB: [birth year] Today's Date: 12/15/2016    History of Present Illness Pt s/p R TKR and with recent hx of L TKR    PT Comments    Pt progressing well and eager for dc home.   Follow Up Recommendations  Home health PT     Equipment Recommendations  None recommended by PT    Recommendations for Other Services OT consult     Precautions / Restrictions Precautions Precautions: Knee;Fall Restrictions Weight Bearing Restrictions: No Other Position/Activity Restrictions: WBAT    Mobility  Bed Mobility Overal bed mobility: Modified Independent             General bed mobility comments: Pt unassisted to/from bed  Transfers Overall transfer level: Needs assistance Equipment used: Rolling walker (2 wheeled) Transfers: Sit to/from Stand Sit to Stand: Modified independent (Device/Increase time)         General transfer comment: pt self cues for LE management and use of UEs to self assist  Ambulation/Gait Ambulation/Gait assistance: Supervision Ambulation Distance (Feet): 300 Feet Assistive device: Rolling walker (2 wheeled) Gait Pattern/deviations: Step-to pattern;Step-through pattern;Shuffle;Decreased step length - right;Decreased step length - left;Trunk flexed Gait velocity: decr Gait velocity interpretation: Below normal speed for age/gender General Gait Details: min cues for posture, position from RW    Stairs         General stair comments: Pt feels confident in ability on stairs after practice yesterday  Wheelchair Mobility    Modified Rankin (Stroke Patients Only)       Balance                                            Cognition Arousal/Alertness: Awake/alert Behavior During Therapy: WFL for tasks assessed/performed Overall Cognitive Status: Within Functional Limits for tasks assessed                                         Exercises Total Joint Exercises Ankle Circles/Pumps: AROM;Both;15 reps;Supine Quad Sets: AROM;Both;Supine;15 reps Heel Slides: AAROM;Right;Supine;20 reps Straight Leg Raises: AAROM;AROM;Right;Supine;20 reps Long Arc Quad: AROM;Right;10 reps;Seated Knee Flexion: AROM;Right;15 reps;Seated Goniometric ROM: AAROM R knee -8 - 90    General Comments        Pertinent Vitals/Pain Pain Assessment: 0-10 Pain Score: 7  Pain Location: R knee Pain Descriptors / Indicators: Aching;Sore Pain Intervention(s): Limited activity within patient's tolerance;Monitored during session;Premedicated before session;Ice applied    Home Living                      Prior Function            PT Goals (current goals can now be found in the care plan section) Acute Rehab PT Goals Patient Stated Goal: Regain IND and get back to work PT Goal Formulation: With patient Time For Goal Achievement: 12/17/16 Potential to Achieve Goals: Good Progress towards PT goals: Progressing toward goals    Frequency    7X/week      PT Plan Current plan remains appropriate    Co-evaluation              AM-PAC PT "6 Clicks" Daily Activity  Outcome Measure  Difficulty turning over in bed (including adjusting bedclothes, sheets and blankets)?: A Little  Difficulty moving from lying on back to sitting on the side of the bed? : A Little Difficulty sitting down on and standing up from a chair with arms (e.g., wheelchair, bedside commode, etc,.)?: A Little Help needed moving to and from a bed to chair (including a wheelchair)?: None Help needed walking in hospital room?: None Help needed climbing 3-5 steps with a railing? : A Little 6 Click Score: 20    End of Session Equipment Utilized During Treatment: Gait belt Activity Tolerance: Patient tolerated treatment well Patient left: in bed;with call bell/phone within reach Nurse Communication: Mobility status PT Visit Diagnosis: Difficulty in walking,  not elsewhere classified (R26.2)     Time: 0177-9390 PT Time Calculation (min) (ACUTE ONLY): 53 min  Charges:  $Gait Training: 8-22 mins $Therapeutic Exercise: 23-37 mins                    G Codes:       Pg [phone]    Katerin Negrete 12/15/2016, 10:49 AM

## 2016-12-15 NOTE — Progress Notes (Signed)
    Subjective: 2 Days Post-Op Procedure(s) (LRB): RIGHT TOTAL KNEE ARTHROPLASTY WITH COMPUTER NAVIGATION (Right) Patient reports pain as 3 on 0-10 scale.   Denies CP or SOB.  Voiding without difficulty. Positive flatus. No nausea with eating. Pt denies being lighthead or dizzy with ambulation.  Objective: Vital signs in last 24 hours: Temp:  [97.7 F (36.5 C)-98.6 F (37 C)] 97.7 F (36.5 C) (08/11 0539) Pulse Rate:  [71-75] 71 (08/11 0539) Resp:  [16-18] 16 (08/11 0539) BP: (107-130)/(60-90) 107/90 (08/11 0539) SpO2:  [100 %] 100 % (08/11 0539)  Intake/Output from previous day: 08/10 0701 - 08/11 0700 In: 1964.8 [P.O.:1920; I.V.:44.8] Out: 3950 [Urine:3950] Intake/Output this shift: No intake/output data recorded.  Labs:  Recent Labs  12/13/16 1417 12/14/16 0657 12/15/16 0538  HGB 9.9* 8.4* 8.0*    Recent Labs  12/14/16 0657 12/15/16 0538  WBC 8.5 7.4  RBC 2.87* 2.71*  HCT 24.8* 23.8*  PLT 239 212    Recent Labs  12/13/16 1417 12/14/16 0657  NA 141 138  K 3.9 4.2  CL  --  103  CO2  --  29  BUN  --  8  CREATININE  --  0.72  GLUCOSE 104* 147*  CALCIUM  --  8.3*   No results for input(s): LABPT, INR in the last 72 hours.  Physical Exam: Neurologically intact ABD soft Sensation intact distally Dorsiflexion/Plantar flexion intact Incision: no drainage Compartment soft  Assessment/Plan: 2 Days Post-Op Procedure(s) (LRB): RIGHT TOTAL KNEE ARTHROPLASTY WITH COMPUTER NAVIGATION (Right) Pt has sarcoidosis and his Hgb is low normally Per Dr. Linna Caprice we will discharge pt as long as it is above 7. Advised eating foods high in iron.   Discharge pt with home health after he is cleared by PT. Post op medications on chart Pt will present to clinic for post op appt with dr. Laurey Morale, Baxter Kail for Dr. Venita Lick [facility] [phone] 12/15/2016, 8:04 AM    Patient ID: Gerald Robles, male   DOB: December 25, 1965, 51 y.o.    MRN: [id number]

## 2017-01-22 ENCOUNTER — Encounter (HOSPITAL_COMMUNITY): Payer: Self-pay | Admitting: Orthopedic Surgery

## 2017-11-26 IMAGING — CR DG CHEST 1V PORT
1 series · 1 of 1 positions shown · non-contrast
Comparison: None.

CLINICAL DATA: Palpitations, shortness of breath for 1 day

EXAM:
PORTABLE CHEST 1 VIEW

[AP]
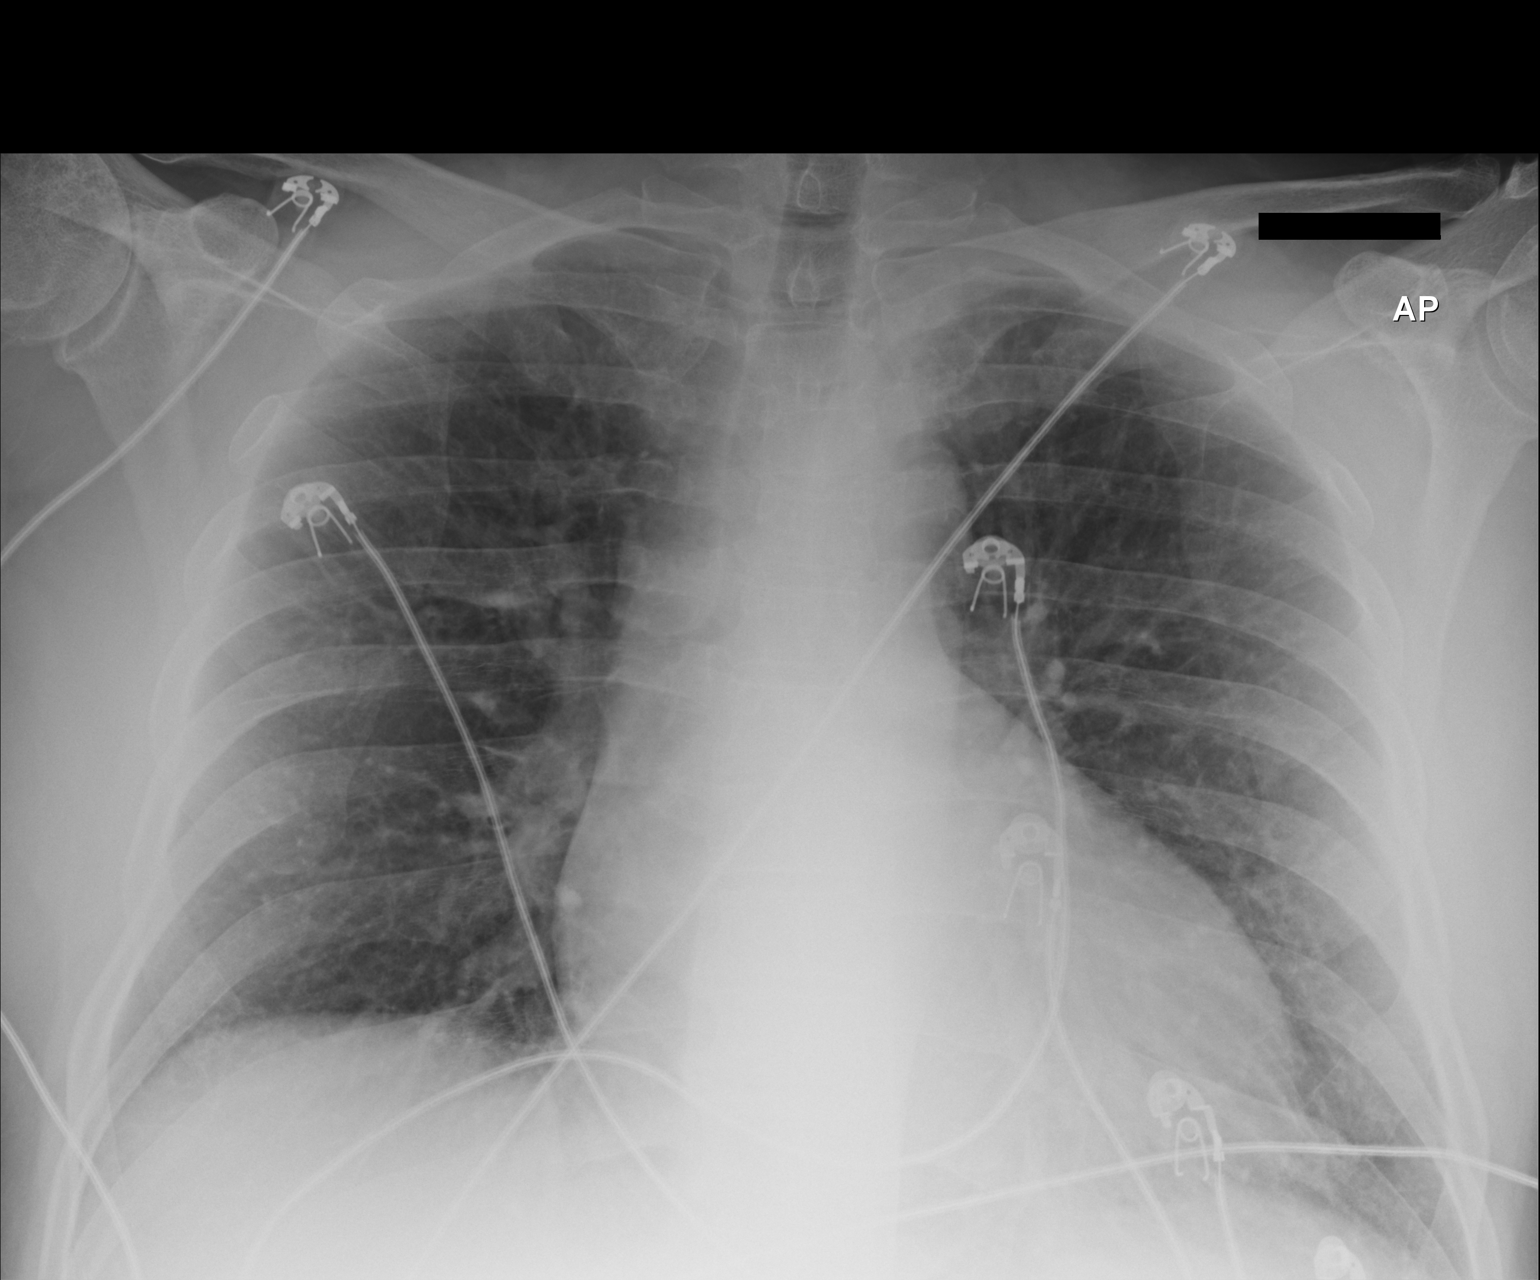

[1 of 1 positions shown; findings below may reference images not displayed]

FINDINGS: The heart size and mediastinal contours are within normal limits.
Both lungs are clear. The visualized skeletal structures are
unremarkable.
IMPRESSION: No active disease.

## 2018-05-13 ENCOUNTER — Other Ambulatory Visit (HOSPITAL_COMMUNITY): Payer: Self-pay | Admitting: Internal Medicine

## 2018-05-13 DIAGNOSIS — I428 Other cardiomyopathies: Secondary | ICD-10-CM

## 2018-05-21 ENCOUNTER — Telehealth (HOSPITAL_COMMUNITY): Payer: Self-pay | Admitting: Emergency Medicine

## 2018-05-21 NOTE — Telephone Encounter (Signed)
Left message on voicemail with name and callback number Mikahla Wisor RN Navigator Cardiac Imaging [phone] 

## 2018-05-22 ENCOUNTER — Ambulatory Visit (HOSPITAL_COMMUNITY)
Admission: RE | Admit: 2018-05-22 | Discharge: 2018-05-22 | Disposition: A | Discharge status: Home / Self Care | Payer: Non-veteran care | Source: Ambulatory Visit | Attending: Internal Medicine | Admitting: Internal Medicine

## 2018-05-22 DIAGNOSIS — I428 Other cardiomyopathies: Secondary | ICD-10-CM | POA: Insufficient documentation

## 2018-05-22 DIAGNOSIS — I429 Cardiomyopathy, unspecified: Secondary | ICD-10-CM

## 2018-05-22 LAB — CREATININE, SERUM
Creatinine, Ser: 0.91 mg/dL (ref 0.61–1.24)
GFR calc Af Amer: >60 mL/min (ref >60)
GFR calc non Af Amer: >60 mL/min (ref >60)

## 2018-05-22 MED ORDER — REGADENOSON 0.4 MG/5ML IV SOLN
INTRAVENOUS | Status: AC
  Filled 2018-05-22: qty 5

## 2020-11-06 ENCOUNTER — Emergency Department (HOSPITAL_COMMUNITY): Payer: No Typology Code available for payment source

## 2020-11-06 ENCOUNTER — Observation Stay (HOSPITAL_COMMUNITY)
Admission: EM | Admit: 2020-11-06 | Discharge: 2020-11-08 | Disposition: A | Discharge status: Home / Self Care | Payer: No Typology Code available for payment source | Attending: Internal Medicine | Admitting: Internal Medicine

## 2020-11-06 ENCOUNTER — Encounter (HOSPITAL_COMMUNITY): Payer: Self-pay | Admitting: Family Medicine

## 2020-11-06 DIAGNOSIS — E871 Hypo-osmolality and hyponatremia: Secondary | ICD-10-CM | POA: Insufficient documentation

## 2020-11-06 DIAGNOSIS — I10 Essential (primary) hypertension: Secondary | ICD-10-CM | POA: Insufficient documentation

## 2020-11-06 DIAGNOSIS — I428 Other cardiomyopathies: Secondary | ICD-10-CM | POA: Diagnosis not present

## 2020-11-06 DIAGNOSIS — Z79899 Other long term (current) drug therapy: Secondary | ICD-10-CM | POA: Insufficient documentation

## 2020-11-06 DIAGNOSIS — Z20822 Contact with and (suspected) exposure to covid-19: Secondary | ICD-10-CM | POA: Diagnosis not present

## 2020-11-06 DIAGNOSIS — I480 Paroxysmal atrial fibrillation: Secondary | ICD-10-CM | POA: Diagnosis not present

## 2020-11-06 DIAGNOSIS — D72819 Decreased white blood cell count, unspecified: Secondary | ICD-10-CM | POA: Insufficient documentation

## 2020-11-06 DIAGNOSIS — Z96653 Presence of artificial knee joint, bilateral: Secondary | ICD-10-CM | POA: Insufficient documentation

## 2020-11-06 DIAGNOSIS — Z7982 Long term (current) use of aspirin: Secondary | ICD-10-CM | POA: Insufficient documentation

## 2020-11-06 LAB — RESP PANEL BY RT-PCR (FLU A&B, COVID) ARPGX2
Influenza A by PCR: NEGATIVE
Influenza B by PCR: NEGATIVE
SARS Coronavirus 2 by RT PCR: NEGATIVE

## 2020-11-06 LAB — PROTIME-INR
INR: 1.2 (ref 0.8–1.2)
Prothrombin Time: 15.1 seconds (ref 11.4–15.2)

## 2020-11-06 LAB — BASIC METABOLIC PANEL
Anion gap: 13 (ref 5–15)
BUN: 10 mg/dL (ref 6–20)
CO2: 22 mmol/L (ref 22–32)
Calcium: 9.2 mg/dL (ref 8.9–10.3)
Chloride: 88 mmol/L — ABNORMAL LOW (ref 98–111)
Creatinine, Ser: 1.02 mg/dL (ref 0.61–1.24)
GFR, Estimated: >60 mL/min (ref >60)
Glucose, Bld: 115 mg/dL — ABNORMAL HIGH (ref 70–99)
Potassium: 4.1 mmol/L (ref 3.5–5.1)
Sodium: 123 mmol/L — ABNORMAL LOW (ref 135–145)

## 2020-11-06 LAB — CBC
HCT: 39.5 % (ref 39.0–52.0)
Hemoglobin: 13.7 g/dL (ref 13.0–17.0)
MCH: 30.2 pg (ref 26.0–34.0)
MCHC: 34.7 g/dL (ref 30.0–36.0)
MCV: 87 fL (ref 80.0–100.0)
Platelets: 267 10*3/uL (ref 150–400)
RBC: 4.54 MIL/uL (ref 4.22–5.81)
RDW: 14.8 % (ref 11.5–15.5)
WBC: 3.9 10*3/uL — ABNORMAL LOW (ref 4.0–10.5)
nRBC: 0 % (ref 0.0–0.2)

## 2020-11-06 LAB — TROPONIN I (HIGH SENSITIVITY)
Troponin I (High Sensitivity): 13 ng/L (ref <18)
Troponin I (High Sensitivity): 14 ng/L (ref <18)

## 2020-11-06 LAB — MAGNESIUM: Magnesium: 2 mg/dL (ref 1.7–2.4)

## 2020-11-06 MED ORDER — HYDROCODONE-ACETAMINOPHEN 5-325 MG PO TABS: 1.0000 | ORAL_TABLET | ORAL | Status: DC | PRN

## 2020-11-06 MED ORDER — ACETAMINOPHEN 650 MG RE SUPP: 650.0000 mg | Freq: Four times a day (QID) | RECTAL | Status: DC | PRN

## 2020-11-06 MED ORDER — SODIUM CHLORIDE 0.9 % IV BOLUS
1000.0000 mL | Freq: Once | INTRAVENOUS | Status: AC
Start: 2020-11-06 — End: 2020-11-06
  Administered 2020-11-06: 1000 mL via INTRAVENOUS

## 2020-11-06 MED ORDER — ONDANSETRON HCL 4 MG/2ML IJ SOLN
4.0000 mg | Freq: Four times a day (QID) | INTRAMUSCULAR | Status: DC | PRN
  Administered 2020-11-07: 4 mg via INTRAVENOUS
  Filled 2020-11-06: qty 2

## 2020-11-06 MED ORDER — LOSARTAN POTASSIUM 50 MG PO TABS
100.0000 mg | ORAL_TABLET | Freq: Every day | ORAL | Status: DC
  Administered 2020-11-07 – 2020-11-08 (×2): 100 mg via ORAL
  Filled 2020-11-06 (×2): qty 2

## 2020-11-06 MED ORDER — RIVAROXABAN 20 MG PO TABS
20.0000 mg | ORAL_TABLET | Freq: Every day | ORAL | Status: DC
  Administered 2020-11-07 – 2020-11-08 (×2): 20 mg via ORAL
  Filled 2020-11-06 (×2): qty 1

## 2020-11-06 MED ORDER — PANTOPRAZOLE SODIUM 40 MG PO TBEC
40.0000 mg | DELAYED_RELEASE_TABLET | Freq: Every day | ORAL | Status: DC
  Administered 2020-11-07 – 2020-11-08 (×2): 40 mg via ORAL
  Filled 2020-11-06 (×2): qty 1

## 2020-11-06 MED ORDER — SODIUM CHLORIDE 0.9 % IV SOLN: INTRAVENOUS | Status: DC

## 2020-11-06 MED ORDER — SODIUM CHLORIDE 0.9 % IV BOLUS: 500.0000 mL | Freq: Once | INTRAVENOUS | Status: DC

## 2020-11-06 MED ORDER — ONDANSETRON HCL 4 MG PO TABS: 4.0000 mg | ORAL_TABLET | Freq: Four times a day (QID) | ORAL | Status: DC | PRN

## 2020-11-06 MED ORDER — ONDANSETRON HCL 4 MG/2ML IJ SOLN
4.0000 mg | Freq: Once | INTRAMUSCULAR | Status: AC
  Administered 2020-11-06: 4 mg via INTRAVENOUS
  Filled 2020-11-06: qty 2

## 2020-11-06 MED ORDER — ATENOLOL 50 MG PO TABS
50.0000 mg | ORAL_TABLET | Freq: Every day | ORAL | Status: DC
  Administered 2020-11-07 – 2020-11-08 (×2): 50 mg via ORAL
  Filled 2020-11-06 (×2): qty 1

## 2020-11-06 MED ORDER — ACETAMINOPHEN 325 MG PO TABS
650.0000 mg | ORAL_TABLET | Freq: Four times a day (QID) | ORAL | Status: DC | PRN
Start: 2020-11-06 — End: 2020-11-08

## 2020-11-06 NOTE — ED Triage Notes (Signed)
Pt c/o SHOB x "a couple days," hx afib (altenolol & xarelto), HTN, compliant w home medications. Endorses dizziness, sweats/chills, nausea. Pt states he's "taken everything I have (medication) & don't feel better at all." 4-7 pain, intermittent based on HR

## 2020-11-06 NOTE — ED Provider Notes (Signed)
Emergency Medicine Provider Triage Evaluation Note  Gerald Robles , a 55 y.o. male  was evaluated in triage.  Pt complains of who presents with concern for palpitations shortness of breath, lightheadedness, and near syncope x2 days.  History of A. fib anticoagulated on Xarelto and rate controlled with atenolol.  Has required cardioversion x3 in the past and has had ablation.  States that he has been at [employer], has not had any flares of his A. fib in 2 years, and states he is never felt short of breath and near syncopal like he has today.  Denies any recent missed doses of Xarelto.  Endorses intermittent left-sided chest pain when the palpitations are more severe..  Review of Systems  Positive: Palpitations, shortness of breath, lightheadedness, near syncope, chest pain Negative: Abdominal pain, nausea, vomiting, diarrhea.  Physical Exam  BP (!) 141/89 (BP Location: Right Arm)   Pulse (!) 105   Temp 98.1 F (36.7 C) (Oral)   Resp 17   Ht 6\' 3"  (1.905 m)   Wt 117.9 kg   SpO2 99%   BMI 32.50 kg/m  Gen:   Awake, no distress   Resp:  Normal effort  MSK:   Moves extremities without difficulty  Other:  Irregularly irregular rhythm, heart rate less than 100 bpm, approximately 88 on manual auscultation by this provider.  Lungs CTA B.  Medical Decision Making  Medically screening exam initiated at 6:52 PM.  Appropriate orders placed.  Gerald Robles was informed that the remainder of the evaluation will be completed by another provider, this initial triage assessment does not replace that evaluation, and the importance of remaining in the ED until their evaluation is complete.  This chart was dictated using voice recognition software, Dragon. Despite the best efforts of this provider to proofread and correct errors, errors may still occur which can change documentation meaning.    Gerald Robles 11/06/20 1854    01/07/21, MD 11/06/20 2102

## 2020-11-06 NOTE — ED Provider Notes (Signed)
[facility] EMERGENCY DEPARTMENT Provider Note   CSN: [id number] Arrival date & time: 11/06/20  1840     History Chief Complaint  Patient presents with   Shortness of Breath   Atrial Fibrillation    Gerald Robles is a 55 y.o. male.  55 year old male with prior medical history as detailed below presents for evaluation.  Patient complains of nausea, fatigue, weakness, and vague substernal chest discomfort.  Symptoms have been ongoing for the last 48 to 72 hours.  Patient with prior history of paroxysmal A. fib status post ablation.  Patient is currently on Xarelto for same.  He reports prior cardioversion in the past as well.  Patient denies fever.  He denies vomiting or diarrhea.  He reports that he has been at the beach with his girlfriend.  He has been spending a lot of time outside in the heat.  He does admit to intermittent use of alcohol over the last day or 2.  He denies significant alcohol consumption.  He denies sensation of palpitations.  He is compliant with atenolol (for rate control).  The history is provided by the patient and medical records.  Illness Location:  Nausea fatigue lightheadedness chest discomfort Severity:  Moderate Onset quality:  Gradual Duration:  2 days Timing:  Constant Progression:  Worsening Chronicity:  New Associated symptoms: fatigue and nausea       Past Medical History:  Diagnosis Date   A-fib (HCC)    Anemia    Anemia    Arthritis    ED (erectile dysfunction)    Hypertension    Sarcoidosis 2003    Patient Active Problem List   Diagnosis Date Noted   Hyponatremia 11/06/2020   Post-traumatic osteoarthritis of right knee 12/13/2016   Osteoarthritis of left knee 08/09/2016   Paroxysmal atrial fibrillation (HCC) 05/04/2015    Past Surgical History:  Procedure Laterality Date   KNEE ARTHROPLASTY Left 08/09/2016   Procedure: LEFT TOTAL KNEE ARTHROPLASTY WITH COMPUTER NAVIGATION;  Surgeon: Samson Frederic, MD;   Location: [facility];  Service: Orthopedics;  Laterality: Left;  Needs RNFA   KNEE ARTHROPLASTY Right 12/13/2016   Procedure: RIGHT TOTAL KNEE ARTHROPLASTY WITH COMPUTER NAVIGATION;  Surgeon: Samson Frederic, MD;  Location: [facility];  Service: Orthopedics;  Laterality: Right;  Needs RNFA   KNEE SURGERY Bilateral 1990   right acl with arthsroscopy; left kee artshroscopy    Right total knee     12/13/16 Dr. Linna Caprice   SHOULDER SURGERY Right    twice   VASECTOMY         Family History  Problem Relation Age of Onset   Hypertension Mother     Social History   Tobacco Use   Smoking status: Never   Smokeless tobacco: Never  Vaping Use   Vaping Use: Never used  Substance Use Topics   Alcohol use: Yes    Alcohol/week: 12.0 standard drinks    Types: 12 Cans of beer per week    Comment: a week   Drug use: No    Home Medications Prior to Admission medications   Medication Sig Start Date End Date Taking? Authorizing Provider  aspirin 81 MG chewable tablet Chew 1 tablet (81 mg total) by mouth 2 (two) times daily. 12/14/16   Swinteck, Arlys John, MD  Diclofenac-Misoprostol 75-0.2 MG TBEC Take 1 tablet by mouth daily.    [provider]  docusate sodium (COLACE) 100 MG capsule Take 1 capsule (100 mg total) by mouth 2 (two) times daily. 12/14/16  Swinteck, Arlys John, MD  losartan (COZAAR) 50 MG tablet Take 50 mg by mouth daily.     [provider]  Omega-3 Fatty Acids (FISH OIL) 1000 MG CAPS Take 2 capsules by mouth daily.    [provider]  ondansetron (ZOFRAN) 4 MG tablet Take 1 tablet (4 mg total) by mouth every 6 (six) hours as needed for nausea. 12/14/16   Swinteck, Arlys John, MD  oxyCODONE (OXYCONTIN) 10 mg 12 hr tablet Take 1 tablet (10 mg total) by mouth PRO. 1 tab PO every 12 hours for 3 days, then 1 tab PO daily for 4 days 12/14/16   Samson Frederic, MD  oxyCODONE-acetaminophen (PERCOCET/ROXICET) 5-325 MG tablet Take 1-2 tablets by mouth every 4 (four) hours as needed for  severe pain. 12/14/16   Swinteck, Arlys John, MD  senna (SENOKOT) 8.6 MG TABS tablet Take 2 tablets (17.2 mg total) by mouth at bedtime. 12/14/16   Samson Frederic, MD    Allergies    Patient has no known allergies.  Review of Systems   Review of Systems  Constitutional:  Positive for fatigue.  Gastrointestinal:  Positive for nausea.  All other systems reviewed and are negative.  Physical Exam Updated Vital Signs BP (!) 148/78   Pulse 74   Temp 98.1 F (36.7 C) (Oral)   Resp 10   Ht 6\' 3"  (1.905 m)   Wt 117.9 kg   SpO2 100%   BMI 32.50 kg/m   Physical Exam Vitals and nursing note reviewed.  Constitutional:      General: He is not in acute distress.    Appearance: Normal appearance. He is well-developed.  HENT:     Head: Normocephalic and atraumatic.  Eyes:     Conjunctiva/sclera: Conjunctivae normal.     Pupils: Pupils are equal, round, and reactive to light.  Cardiovascular:     Rate and Rhythm: Normal rate. Rhythm irregular.     Heart sounds: Normal heart sounds.  Pulmonary:     Effort: Pulmonary effort is normal. No respiratory distress.     Breath sounds: Normal breath sounds.  Abdominal:     General: There is no distension.     Palpations: Abdomen is soft.     Tenderness: There is no abdominal tenderness.  Musculoskeletal:        General: No deformity. Normal range of motion.     Cervical back: Normal range of motion and neck supple.  Skin:    General: Skin is warm and dry.  Neurological:     General: No focal deficit present.     Mental Status: He is alert and oriented to person, place, and time.    ED Results / Procedures / Treatments   Labs (all labs ordered are listed, but only abnormal results are displayed) Labs Reviewed  BASIC METABOLIC PANEL - Abnormal; Notable for the following components:      Result Value   Sodium 123 (*)    Chloride 88 (*)    Glucose, Bld 115 (*)    All other components within normal limits  CBC - Abnormal; Notable for the  following components:   WBC 3.9 (*)    All other components within normal limits  RESP PANEL BY RT-PCR (FLU A&B, COVID) ARPGX2  MAGNESIUM  PROTIME-INR  URINALYSIS, ROUTINE W REFLEX MICROSCOPIC  TROPONIN I (HIGH SENSITIVITY)  TROPONIN I (HIGH SENSITIVITY)    EKG EKG Interpretation  Date/Time:  Sunday November 06 2020 19:24:20 EDT Ventricular Rate:  89 PR Interval:  QRS Duration: 90 QT Interval:  376 QTC Calculation: 458 R Axis:   52 Text Interpretation: Atrial fibrillation Minimal ST elevation, anterior leads Confirmed by Kristine Royal [phone]) on 11/06/2020 7:32:40 PM  Radiology DG Chest 2 View  Result Date: 11/06/2020 CLINICAL DATA:  Shortness of breath.  AFib. EXAM: CHEST - 2 VIEW COMPARISON:  Radiograph 04/07/2015 FINDINGS: The cardiomediastinal contours are normal. Minimal bronchial thickening. Pulmonary vasculature is normal. No consolidation, pleural effusion, or pneumothorax. Degenerative spurring in the lower thoracic spine. No acute osseous abnormalities are seen. IMPRESSION: Minimal bronchial thickening. Electronically Signed   By: Narda Rutherford M.D.   On: 11/06/2020 19:20    Procedures Procedures   Medications Ordered in ED Medications  ondansetron [facility]) injection 4 mg (4 mg Intravenous Given 11/06/20 2007)  sodium chloride 0.9 % bolus 1,000 mL (1,000 mLs Intravenous New Bag/Given 11/06/20 2009)    ED Course  I have reviewed the triage vital signs and the nursing notes.  Pertinent labs & imaging results that were available during my care of the patient were reviewed by me and considered in my medical decision making (see chart for details).    MDM Rules/Calculators/A&P                          MDM MSE complete  Brayln Duque was evaluated in Emergency Department on 11/06/2020 for the symptoms described in the history of present illness. He was evaluated in the context of the global COVID-19 pandemic, which necessitated consideration that the patient might be at  risk for infection with the SARS-CoV-2 virus that causes COVID-19. Institutional protocols and algorithms that pertain to the evaluation of patients at risk for COVID-19 are in a state of rapid change based on information released by regulatory bodies including the CDC and federal and state organizations. These policies and algorithms were followed during the patient's care in the ED.   Patient is presenting with complaint of nausea, fatigue, and weakness.  Patient reports atypical chest discomfort.   EKG demonstrates A. fib with rate controlled in the 80's.  Observation of patient's monitor suggest that patient is intermittently in and out of A. fib and normal sinus rhythm.   Initial troponin 13.   Work-up demonstrates presence of hyponatremia (new).   Case discussed with cardiology fellow Lysle Morales).  Cardiology happy to consult.  No indication for emergent cardioversion at this time.  Patient will require admission for further work-up and treatment of new hyponatremia.  Hospitalist service is aware of case and will evaluate for admission.   Final Clinical Impression(s) / ED Diagnoses Final diagnoses:  Hyponatremia    Rx / DC Orders ED Discharge Orders          Ordered    Amb referral to AFIB Clinic        11/06/20 1852             Wynetta Fines, MD 11/06/20 2128

## 2020-11-06 NOTE — H&P (Signed)
History and Physical    Gerald Robles DJM:[id number] DOB: [birth year] DOA: 11/06/2020  PCP: Marden Noble, MD   Patient coming from: Home   Chief Complaint: Nausea, lightheadedness, malaise   HPI: Gerald Robles is a 55 y.o. male with medical history significant for atrial fibrillation on Xarelto, hypertension, and nonischemic cardiomyopathy, now presenting to the emergency department with lightheadedness, nausea, and malaise.  Patient reports that he had [place] for the past [place], spending days outside in the heat, and has developed nausea, lightheadedness, general malaise, and has had a couple episodes of vomiting but no abdominal pain or diarrhea.  He has felt as though he might pass out a couple times but has not lost consciousness.  He had a fleeting episode of chest discomfort yesterday that felt similar to when he has been in A. fib in the past but has not recurred.  Reports drinking approximately 3 beers per day.  Reports that he has had ankle swelling in the past but not recently.  ED Course: Upon arrival to the ED, patient is found to be afebrile, saturating well on room air, and with stable blood pressure.  EKG features atrial fibrillation with minimal ST elevation in anterior leads.  Chest x-ray notable for minimal bronchial thickening.  Chemistry panel features a sodium of 123.  ED physician reviewed the case with cardiology, 1 L of saline was administered, and hospitalists asked to admit.  Review of Systems:  All other systems reviewed and apart from HPI, are negative.  Past Medical History:  Diagnosis Date   A-fib (HCC)    Anemia    Anemia    Arthritis    ED (erectile dysfunction)    Hypertension    Sarcoidosis 2003    Past Surgical History:  Procedure Laterality Date   KNEE ARTHROPLASTY Left 08/09/2016   Procedure: LEFT TOTAL KNEE ARTHROPLASTY WITH COMPUTER NAVIGATION;  Surgeon: Samson Frederic, MD;  Location: [facility];  Service: Orthopedics;   Laterality: Left;  Needs RNFA   KNEE ARTHROPLASTY Right 12/13/2016   Procedure: RIGHT TOTAL KNEE ARTHROPLASTY WITH COMPUTER NAVIGATION;  Surgeon: Samson Frederic, MD;  Location: [facility];  Service: Orthopedics;  Laterality: Right;  Needs RNFA   KNEE SURGERY Bilateral 1990   right acl with arthsroscopy; left kee artshroscopy    Right total knee     12/13/16 Dr. Linna Caprice   SHOULDER SURGERY Right    twice   VASECTOMY      Social History:   reports that he has never smoked. He has never used smokeless tobacco. He reports current alcohol use of about 12.0 standard drinks of alcohol per week. He reports that he does not use drugs.  No Known Allergies  Family History  Problem Relation Age of Onset   Hypertension Mother      Prior to Admission medications   Medication Sig Start Date End Date Taking? Authorizing Provider  atenolol (TENORMIN) 50 MG tablet Take 50 mg by mouth daily.   Yes [provider]  rivaroxaban (XARELTO) 20 MG TABS tablet Take 20 mg by mouth daily with supper.   Yes [provider]  aspirin 81 MG chewable tablet Chew 1 tablet (81 mg total) by mouth 2 (two) times daily. 12/14/16   Swinteck, Arlys John, MD  Diclofenac-Misoprostol 75-0.2 MG TBEC Take 1 tablet by mouth daily.    [provider]  docusate sodium (COLACE) 100 MG capsule Take 1 capsule (100 mg total) by mouth 2 (two) times  daily. 12/14/16   Swinteck, Arlys John, MD  losartan (COZAAR) 50 MG tablet Take 100 mg by mouth daily.    [provider]  Omega-3 Fatty Acids (FISH OIL) 1000 MG CAPS Take 2 capsules by mouth daily.    [provider]  ondansetron (ZOFRAN) 4 MG tablet Take 1 tablet (4 mg total) by mouth every 6 (six) hours as needed for nausea. 12/14/16   Swinteck, Arlys John, MD  oxyCODONE (OXYCONTIN) 10 mg 12 hr tablet Take 1 tablet (10 mg total) by mouth PRO. 1 tab PO every 12 hours for 3 days, then 1 tab PO daily for 4 days 12/14/16   Samson Frederic, MD  oxyCODONE-acetaminophen  (PERCOCET/ROXICET) 5-325 MG tablet Take 1-2 tablets by mouth every 4 (four) hours as needed for severe pain. 12/14/16   Swinteck, Arlys John, MD  senna (SENOKOT) 8.6 MG TABS tablet Take 2 tablets (17.2 mg total) by mouth at bedtime. 12/14/16   Samson Frederic, MD    Physical Exam: Vitals:   11/06/20 2045 11/06/20 2115 11/06/20 2137 11/06/20 2204  BP: (!) 148/78 129/78  (!) 146/71  Pulse: 74 84  82  Resp: 10 13  16   Temp:   98 F (36.7 C) 98.2 F (36.8 C)  TempSrc:   Oral Oral  SpO2: 100% 99%  99%  Weight:    119.7 kg  Height:    6\' 3"  (1.905 m)    Constitutional: NAD, calm  Eyes: PERTLA, lids and conjunctivae normal ENMT: Mucous membranes are moist. Posterior pharynx clear of any exudate or lesions.   Neck:  supple, no masses  Respiratory:  no wheezing, no crackles. No accessory muscle use.  Cardiovascular: Rate ~80 and irregularly irregular. No extremity edema.   Abdomen: No distension, no tenderness, soft. Bowel sounds active.  Musculoskeletal: no clubbing / cyanosis. No joint deformity upper and lower extremities.   Skin: no significant rashes, lesions, ulcers. Warm, dry, well-perfused. Neurologic: CN 2-12 grossly intact. Sensation intact. Moving all extremities.  Psychiatric: Alert and oriented to person, place, and situation. Very pleasant and cooperative.    Labs and Imaging on Admission: I have personally reviewed following labs and imaging studies  CBC: Recent Labs  Lab 11/06/20 1906  WBC 3.9*  HGB 13.7  HCT 39.5  MCV 87.0  PLT 267   Basic Metabolic Panel: Recent Labs  Lab 11/06/20 1906  NA 123*  K 4.1  CL 88*  CO2 22  GLUCOSE 115*  BUN 10  CREATININE 1.02  CALCIUM 9.2  MG 2.0   GFR: Estimated Creatinine Clearance: 114.1 mL/min (by C-G formula based on SCr of 1.02 mg/dL). Liver Function Tests: No results for input(s): AST, ALT, ALKPHOS, BILITOT, PROT, ALBUMIN in the last 168 hours. No results for input(s): LIPASE, AMYLASE in the last 168 hours. No  results for input(s): AMMONIA in the last 168 hours. Coagulation Profile: Recent Labs  Lab 11/06/20 1906  INR 1.2   Cardiac Enzymes: No results for input(s): CKTOTAL, CKMB, CKMBINDEX, TROPONINI in the last 168 hours. BNP (last 3 results) No results for input(s): PROBNP in the last 8760 hours. HbA1C: No results for input(s): HGBA1C in the last 72 hours. CBG: No results for input(s): GLUCAP in the last 168 hours. Lipid Profile: No results for input(s): CHOL, HDL, LDLCALC, TRIG, CHOLHDL, LDLDIRECT in the last 72 hours. Thyroid Function Tests: No results for input(s): TSH, T4TOTAL, FREET4, T3FREE, THYROIDAB in the last 72 hours. Anemia Panel: No results for input(s): VITAMINB12, FOLATE, FERRITIN, TIBC, IRON, RETICCTPCT in the  last 72 hours. Urine analysis: No results found for: COLORURINE, APPEARANCEUR, LABSPEC, PHURINE, GLUCOSEU, HGBUR, BILIRUBINUR, KETONESUR, PROTEINUR, UROBILINOGEN, NITRITE, LEUKOCYTESUR Sepsis Labs: @LABRCNTIP (procalcitonin:4,lacticidven:4) ) Recent Results (from the past 240 hour(s))  Resp Panel by RT-PCR (Flu A&B, Covid) Nasopharyngeal Swab     Status: None   Collection Time: 11/06/20  8:11 PM   Specimen: Nasopharyngeal Swab; Nasopharyngeal(NP) swabs in vial transport medium  Result Value Ref Range Status   SARS Coronavirus 2 by RT PCR NEGATIVE NEGATIVE Final    Comment: (NOTE) SARS-CoV-2 target nucleic acids are NOT DETECTED.  The SARS-CoV-2 RNA is generally detectable in upper respiratory specimens during the acute phase of infection. The lowest concentration of SARS-CoV-2 viral copies this assay can detect is 138 copies/mL. A negative result does not preclude SARS-Cov-2 infection and should not be used as the sole basis for treatment or other patient management decisions. A negative result may occur with  improper specimen collection/handling, submission of specimen other than nasopharyngeal swab, presence of viral mutation(s) within the areas targeted  by this assay, and inadequate number of viral copies(<138 copies/mL). A negative result must be combined with clinical observations, patient history, and epidemiological information. The expected result is Negative.  Fact Sheet for Patients:  01/07/21  Fact Sheet for Healthcare Providers:  BloggerCourse.com  This test is no t yet approved or cleared by the [employer].[place] FDA and  has been authorized for detection and/or diagnosis of SARS-CoV-2 by FDA under an Emergency Use Authorization (EUA). This EUA will remain  in effect (meaning this test can be used) for the duration of the COVID-19 declaration under Section 564(b)(1) of the Act, 21 U.S.C.section 360bbb-3(b)(1), unless the authorization is terminated  or revoked sooner.       Influenza A by PCR NEGATIVE NEGATIVE Final   Influenza B by PCR NEGATIVE NEGATIVE Final    Comment: (NOTE) The Xpert Xpress SARS-CoV-2/FLU/RSV plus assay is intended as an aid in the diagnosis of influenza from Nasopharyngeal swab specimens and should not be used as a sole basis for treatment. Nasal washings and aspirates are unacceptable for Xpert Xpress SARS-CoV-2/FLU/RSV testing.  Fact Sheet for Patients: [place]  Fact Sheet for Healthcare Providers: BloggerCourse.com  This test is not yet approved or cleared by the [employer]ious[employer]ker.[place] FDA and has been authorized for detection and/or diagnosis of SARS-CoV-2 by FDA under an Emergency Use Authorization (EUA). This EUA will remain in effect (meaning this test can be used) for the duration of the COVID-19 declaration under Section 564(b)(1) of the Act, 21 U.S.C. section 360bbb-3(b)(1), unless the authorization is terminated or revoked.  Performed at [facility], [address] [place], [place]      Radiological Exams on Admission: DG Chest 2 View  Result Date:  11/06/2020 CLINICAL DATA:  Shortness of breath.  AFib. EXAM: CHEST - 2 VIEW COMPARISON:  Radiograph 04/07/2015 FINDINGS: The cardiomediastinal contours are normal. Minimal bronchial thickening. Pulmonary vasculature is normal. No consolidation, pleural effusion, or pneumothorax. Degenerative spurring in the lower thoracic spine. No acute osseous abnormalities are seen. IMPRESSION: Minimal bronchial thickening. Electronically Signed   By: 14/05/2014 M.D.   On: 11/06/2020 19:20    EKG: Independently reviewed. Atrial fibrillation, minimal ST elevation anteriorly.   Assessment/Plan   1. Hyponatremia  - Presents with nausea, lightheadedness, and malaise and is found to have serum sodium of 123  - He appears hypovolemic and was given a liter of NS in ED  - No severe symptoms  - Check  urine sodium and osm (though 1 liter NS already given), restrict free water, follow serial serum chemistry panels with goal increase 4-6 mEq/L over 24 hrs    2. Atrial fibrillation  - In rate-controlled atrial fibrillation on admission  - Continue atenolol and Xarelto    3. Hypertension  - Continue losartan and atenolol    4. Non-ischemic cardiomyopathy  - EF 45-50% in 2017  - Appears hypovolemic on admission  - Continue ARB and beta-blocker and monitor volume status    DVT prophylaxis: Xarelto  Code Status: Full  Level of Care: Level of care: Telemetry Cardiac Family Communication: Significant other updated at bedside  Disposition Plan:  Patient is from: Home  Anticipated d/c is to: Home  Anticipated d/c date is: 7/4 or 11/08/20 Patient currently: Pending improvement in sodium levels, possible cardiology consultation  Consults called: ED discussed with cardiology  Admission status: Observation     Briscoe Deutscher, MD [facility]  11/06/2020, 10:47 PM

## 2020-11-07 ENCOUNTER — Other Ambulatory Visit: Payer: Self-pay

## 2020-11-07 DIAGNOSIS — I4891 Unspecified atrial fibrillation: Secondary | ICD-10-CM | POA: Diagnosis not present

## 2020-11-07 DIAGNOSIS — E871 Hypo-osmolality and hyponatremia: Secondary | ICD-10-CM

## 2020-11-07 LAB — BASIC METABOLIC PANEL
Anion gap: 10 (ref 5–15)
Anion gap: 8 (ref 5–15)
BUN: 8 mg/dL (ref 6–20)
BUN: 7 mg/dL (ref 6–20)
CO2: 26 mmol/L (ref 22–32)
CO2: 29 mmol/L (ref 22–32)
Calcium: 9.2 mg/dL (ref 8.9–10.3)
Calcium: 9.3 mg/dL (ref 8.9–10.3)
Chloride: 93 mmol/L — ABNORMAL LOW (ref 98–111)
Chloride: 94 mmol/L — ABNORMAL LOW (ref 98–111)
Creatinine, Ser: 0.95 mg/dL (ref 0.61–1.24)
Creatinine, Ser: 1.02 mg/dL (ref 0.61–1.24)
GFR, Estimated: >60 mL/min (ref >60)
GFR, Estimated: >60 mL/min (ref >60)
Glucose, Bld: 101 mg/dL — ABNORMAL HIGH (ref 70–99)
Glucose, Bld: 133 mg/dL — ABNORMAL HIGH (ref 70–99)
Potassium: 4.1 mmol/L (ref 3.5–5.1)
Potassium: 4.3 mmol/L (ref 3.5–5.1)
Sodium: 129 mmol/L — ABNORMAL LOW (ref 135–145)
Sodium: 131 mmol/L — ABNORMAL LOW (ref 135–145)

## 2020-11-07 LAB — CBC WITH DIFFERENTIAL/PLATELET
Abs Immature Granulocytes: 0.01 10*3/uL (ref 0.00–0.07)
Basophils Absolute: 0 10*3/uL (ref 0.0–0.1)
Basophils Relative: 0 %
Eosinophils Absolute: 0 10*3/uL (ref 0.0–0.5)
Eosinophils Relative: 0 %
HCT: 39 % (ref 39.0–52.0)
Hemoglobin: 13.4 g/dL (ref 13.0–17.0)
Immature Granulocytes: 0 %
Lymphocytes Relative: 33 %
Lymphs Abs: 0.9 10*3/uL (ref 0.7–4.0)
MCH: 30.2 pg (ref 26.0–34.0)
MCHC: 34.4 g/dL (ref 30.0–36.0)
MCV: 88 fL (ref 80.0–100.0)
Monocytes Absolute: 0.7 10*3/uL (ref 0.1–1.0)
Monocytes Relative: 25 %
Neutro Abs: 1.1 10*3/uL — ABNORMAL LOW (ref 1.7–7.7)
Neutrophils Relative %: 42 %
Platelets: 213 10*3/uL (ref 150–400)
RBC: 4.43 MIL/uL (ref 4.22–5.81)
RDW: 15 % (ref 11.5–15.5)
WBC: 2.6 10*3/uL — ABNORMAL LOW (ref 4.0–10.5)
nRBC: 0 % (ref 0.0–0.2)

## 2020-11-07 LAB — URINALYSIS, ROUTINE W REFLEX MICROSCOPIC
Bacteria, UA: NONE SEEN
Bilirubin Urine: NEGATIVE
Glucose, UA: NEGATIVE mg/dL
Ketones, ur: 20 mg/dL — AB
Leukocytes,Ua: NEGATIVE
Nitrite: NEGATIVE
Protein, ur: NEGATIVE mg/dL
Specific Gravity, Urine: 1.005 (ref 1.005–1.030)
pH: 6 (ref 5.0–8.0)

## 2020-11-07 LAB — PROTIME-INR
INR: 1.5 — ABNORMAL HIGH (ref 0.8–1.2)
Prothrombin Time: 18.5 seconds — ABNORMAL HIGH (ref 11.4–15.2)

## 2020-11-07 LAB — HIV ANTIBODY (ROUTINE TESTING W REFLEX): HIV Screen 4th Generation wRfx: NONREACTIVE

## 2020-11-07 LAB — OSMOLALITY: Osmolality: 270 mOsm/kg — ABNORMAL LOW (ref 275–295)

## 2020-11-07 LAB — OSMOLALITY, URINE: Osmolality, Ur: 181 mOsm/kg — ABNORMAL LOW (ref 300–900)

## 2020-11-07 LAB — SODIUM, URINE, RANDOM: Sodium, Ur: 24 mmol/L

## 2020-11-07 MED ORDER — SODIUM CHLORIDE 0.9 % IV SOLN: INTRAVENOUS | Status: DC

## 2020-11-07 NOTE — Progress Notes (Addendum)
Cardiology Consultation:   Patient ID: Gerald Robles MRN: [id number]; DOB: [birth year]  Admit date: 11/06/2020 Date of Consult: 11/07/2020  PCP:  Marden Noble, MD   [facility] Providers Cardiologist: Anne Fu  NOw:   [facility] Kohl with Dr Dierdre Harness, [place]   Patient Profile:   Gerald Robles is a 55 y.o. male with a hx of PAF  who is being seen 11/07/2020 for the evaluation of atrial fibrillation  at the request of Dr.  History of Present Illness:   Mr. Gerald Robles  is a 55 yo with hx of sarcoid, HTN, obesity , NICM, EtOH abuse and PAF   Underwent cardioversion in ED in 2016   Felt better   Last seen in 2017 by Michele Rockers    He is also followed through the [place] in [place] by cardiology (Dr Andee Poles) and  by EP (Dr Allena Katz, Dr Lowella Bandy)   Last seen in Jan 2021  Had cryo PVI in Dec 2020 The patient says the last time he had afib was in March 2020  The pt says he was feeling good   For the past few [place]  About 1 wk ago became dizzy  Started drinking more     Was at late this weekend   Saturday (2 days aog) got dizzy, nauseated.   Didn't eat much   Laid down   Had sweats and then cold   Took cold showers   Almost passed out.   COmplains of a diffuse HA   Had some minor chest pain    Yesterday (Sunday) his watch noted his heart rate was elevated    Came to [facility] ED  IN ED was in afib   Labs signif for Na of 123  BUN CR 10, 1.02  Pt currently getting IV fluids (100 cc per hour)   Feeling some better  Denies CP   Breathing is OK     Past Medical History:  Diagnosis Date   A-fib (HCC)    Anemia    Anemia    Arthritis    ED (erectile dysfunction)    Hypertension    Sarcoidosis 2003    Past Surgical History:  Procedure Laterality Date   KNEE ARTHROPLASTY Left 08/09/2016   Procedure: LEFT TOTAL KNEE ARTHROPLASTY WITH COMPUTER NAVIGATION;  Surgeon: Samson Frederic, MD;  Location: [facility];  Service: Orthopedics;  Laterality: Left;  Needs RNFA   KNEE  ARTHROPLASTY Right 12/13/2016   Procedure: RIGHT TOTAL KNEE ARTHROPLASTY WITH COMPUTER NAVIGATION;  Surgeon: Samson Frederic, MD;  Location: [facility];  Service: Orthopedics;  Laterality: Right;  Needs RNFA   KNEE SURGERY Bilateral 1990   right acl with arthsroscopy; left kee artshroscopy    Right total knee     12/13/16 Dr. Linna Caprice   SHOULDER SURGERY Right    twice   VASECTOMY       Home Medications:  Prior to Admission medications   Medication Sig Start Date End Date Taking? Authorizing Provider  atenolol (TENORMIN) 50 MG tablet Take 50 mg by mouth daily.   Yes [provider]  rivaroxaban (XARELTO) 20 MG TABS tablet Take 20 mg by mouth daily with supper.   Yes [provider]  aspirin 81 MG chewable tablet Chew 1 tablet (81 mg total) by mouth 2 (two) times daily. 12/14/16   Swinteck, Arlys John, MD  Diclofenac-Misoprostol 75-0.2 MG TBEC Take 1 tablet by mouth daily.    [provider]  docusate sodium (COLACE) 100 MG capsule Take 1 capsule (100 mg total) by mouth 2 (two) times daily. 12/14/16   Swinteck, Arlys John, MD  losartan (COZAAR) 50 MG tablet Take 100 mg by mouth daily.    [provider]  Omega-3 Fatty Acids (FISH OIL) 1000 MG CAPS Take 2 capsules by mouth daily.    [provider]  ondansetron (ZOFRAN) 4 MG tablet Take 1 tablet (4 mg total) by mouth every 6 (six) hours as needed for nausea. 12/14/16   Swinteck, Arlys John, MD  oxyCODONE (OXYCONTIN) 10 mg 12 hr tablet Take 1 tablet (10 mg total) by mouth PRO. 1 tab PO every 12 hours for 3 days, then 1 tab PO daily for 4 days 12/14/16   Samson Frederic, MD  oxyCODONE-acetaminophen (PERCOCET/ROXICET) 5-325 MG tablet Take 1-2 tablets by mouth every 4 (four) hours as needed for severe pain. 12/14/16   Swinteck, Arlys John, MD  senna (SENOKOT) 8.6 MG TABS tablet Take 2 tablets (17.2 mg total) by mouth at bedtime. 12/14/16   Swinteck, Arlys John, MD    Inpatient Medications: Scheduled Meds:  atenolol  50 mg Oral Daily    losartan  100 mg Oral Daily   pantoprazole  40 mg Oral Daily   rivaroxaban  20 mg Oral Q supper   Continuous Infusions:  sodium chloride     PRN Meds: acetaminophen **OR** acetaminophen, HYDROcodone-acetaminophen, ondansetron **OR** ondansetron (ZOFRAN) IV  Allergies:   No Known Allergies  Social History:   Social History   Socioeconomic History   Marital status: Single    Spouse name: Not on file   Number of children: 1   Years of education: Not on file   Highest education level: Not on file  Occupational History   Not on file  Tobacco Use   Smoking status: Never   Smokeless tobacco: Never  Vaping Use   Vaping Use: Never used  Substance and Sexual Activity   Alcohol use: Yes    Alcohol/week: 12.0 standard drinks    Types: 12 Cans of beer per week    Comment: a week   Drug use: No   Sexual activity: Yes    Birth control/protection: Condom  Other Topics Concern   Not on file  Social History Narrative   Not on file   Social Determinants of Health   Financial Resource Strain: Not on file  Food Insecurity: Not on file  Transportation Needs: Not on file  Physical Activity: Not on file  Stress: Not on file  Social Connections: Not on file  Intimate Partner Violence: Not on file    Family History:    Family History  Problem Relation Age of Onset   Hypertension Mother      ROS:  Please see the history of present illness.   All other ROS reviewed and negative.     Physical Exam/Data:   Vitals:   11/06/20 2115 11/06/20 2137 11/06/20 2204 11/07/20 0402  BP: 129/78  (!) 146/71 122/74  Pulse: 84  82 64  Resp: 13  16 16   Temp:  98 F (36.7 C) 98.2 F (36.8 C) 97.9 F (36.6 C)  TempSrc:  Oral Oral Oral  SpO2: 99%  99% 96%  Weight:   119.7 kg   Height:   6\' 3"  (1.905 m)     Intake/Output Summary (Last 24 hours) at 11/07/2020 1135 Last data filed at 11/07/2020 0521 Gross per 24 hour  Intake --  Output 600 ml  Net -600 ml   Last  3 Weights 11/06/2020  11/06/2020 12/13/2016  Weight (lbs) 263 lb 14.4 oz 260 lb 263 lb  Weight (kg) 119.704 kg 117.935 kg 119.296 kg     Body mass index is 32.99 kg/m.  General:  Well nourished, well developed, in no acute distress HEENT: normal Lymph: no adenopathy Neck: no JVD Endocrine:  No thryomegaly Vascular: No carotid bruits; FA pulses 2+ bilaterally without bruits  Cardiac: Irreg irreg  No S3  No murmurs   Lungs:  clear to auscultation bilaterally, no wheezing, rhonchi or rales  Abd: soft, nontender, no hepatomegaly  Ext: no edema Musculoskeletal:  No deformities, BUE and BLE strength normal and equal Skin: warm and dry  Neuro:  CNs 2-12 intact, no focal abnormalities noted Psych:  Normal affect   EKG:  The EKG was personally reviewed and demonstrates:  Atrial fib 89 bpm   ST elev consistent with early repolarization    (ST changes present in EKG from 2018) Telemetry:  Telemetry was personally reviewed and demonstrates:  Afib   80s     Relevant CV Studies: Cardiac Stress MRI 05/2018  [facility])   1. Mildly dilated left ventricle with mild basal septal hypertrophy and mildly reduced systolic function (LVEF = 47%). There is mild diffuse hypokinesis. There is no late gadolinium enhancement in the left ventricular myocardium. LVEDD: 57 mm LVESD: 42 mm LVEDV: 230 ml LVESV: 120 ml SV: 110 ml CO: 9.7 L/min Myocardial mass: 184 g 2. Normal right ventricular size, thickness and systolic function (LVEF = 47%). There are no regional wall motion abnormalities. 3.  Mildly dilated left atrium. Normal right atrial size. 4. Normal size of the aortic root, ascending aorta and pulmonary artery. 5.  Mild mitral regurgitation. 6.  Normal pericardium.  Trivial pericardial effusion. 7.  No perfusion defect was seen on the stress or resting images. IMPRESSION: 1. Mildly dilated left ventricle with mild basal septal hypertrophy and mildly reduced systolic function (LVEF = 47%). There is mild diffuse hypokinesis. There  is no late gadolinium enhancement in the left ventricular myocardium. 2. Normal right ventricular size, thickness and systolic function (LVEF = 47%). There are no regional wall motion abnormalities. 3.  Mildly dilated left atrium. Normal right atrial size. 4. Normal size of the aortic root, ascending aorta and pulmonary artery. 5.  Mild mitral regurgitation. 6.  Normal pericardium.  Trivial pericardial effusion. 7. Normal pharmacologic MRI stress test with Regadenoson with no evidence for a prior scar or ischemia. Collectively, these findings are consistent with non-ischemic cardiomyopathy with mild left ventriclar dysfunction and no evidence for cardiac sarcoidosis.   Laboratory Data:  High Sensitivity Troponin:   Recent Labs  Lab 11/06/20 2006 11/06/20 2226  TROPONINIHS 13 14     Chemistry Recent Labs  Lab 11/06/20 1906 11/07/20 0031 11/07/20 0703  NA 123* 129* 131*  K 4.1 4.1 4.3  CL 88* 93* 94*  CO2 22 26 29   GLUCOSE 115* 101* 133*  BUN 10 8 7   CREATININE 1.02 0.95 1.02  CALCIUM 9.2 9.2 9.3  GFRNONAA >60 >60 >60  ANIONGAP 13 10 8     No results for input(s): PROT, ALBUMIN, AST, ALT, ALKPHOS, BILITOT in the last 168 hours. Hematology Recent Labs  Lab 11/06/20 1906 11/07/20 0703  WBC 3.9* 2.6*  RBC 4.54 4.43  HGB 13.7 13.4  HCT 39.5 39.0  MCV 87.0 88.0  MCH 30.2 30.2  MCHC 34.7 34.4  RDW 14.8 15.0  PLT 267 213   BNPNo results for input(s): BNP, PROBNP  in the last 168 hours.  DDimer No results for input(s): DDIMER in the last 168 hours.   Radiology/Studies:  DG Chest 2 View  Result Date: 11/06/2020 CLINICAL DATA:  Shortness of breath.  AFib. EXAM: CHEST - 2 VIEW COMPARISON:  Radiograph 04/07/2015 FINDINGS: The cardiomediastinal contours are normal. Minimal bronchial thickening. Pulmonary vasculature is normal. No consolidation, pleural effusion, or pneumothorax. Degenerative spurring in the lower thoracic spine. No acute osseous abnormalities are seen.  IMPRESSION: Minimal bronchial thickening. Electronically Signed   By: Narda Rutherford M.D.   On: 11/06/2020 19:20     Assessment and Plan:   55 yo with hx of NICM (mild LV dysfunction), PAF (s/p ablation) presents with dehydration and electrolyte aabnormalities    Found to be in afib   Rates contrrolled  1  Atrial fibrillation Hx of PAF  CHADSVASC is 2   He is  s/p ablation   THis is first spell since the procedure in 2020   He says he doesn't feel good when in afib    ON exam, rates controlled   He says he has not missed any of Xarelto     Most likely current event exacerbated by dehydration , electrolyte abnormalities     Getting hydrated now. If he is still in afib tomorrow would plan cardioversion     2   Hx NICM   MRI noted above    [facility]   WOuld continue atenolol and losartan      PT says he does not feel good when he is in afib   More at rest.    Rates have been controlled here   he has not missed any anticoagulation      3  Hyponatremia  Na is improving with NS infusion   Follow  Counselled on hydration, watch when dizzy since on Xarelto     4  EToH    Counselled on EtOH   He says he has a few beers per day  Cut back   Pt has appt at [facility] next week  Keep         For questions or updates, please contact [facility] Please consult [email or web address] for contact info under    Signed, Dietrich Pates, MD  11/07/2020 11:35 AM

## 2020-11-07 NOTE — Progress Notes (Addendum)
Gerald Robles  STM:[id number] DOB: 1966/03/10 DOA: 11/06/2020 PCP: Marden Noble, MD   Brief Narrative: 55 year old with past medical history significant for A. fib on Xarelto, hypertension, nonischemic cardiomyopathy who presents complaining of lightheadedness, nausea and malaise.  He has been spending his days outside in the heat, when he developed nausea lightheadedness general malaise, had a couple of episode of vomiting.  He has episode of chest discomfort similar when he has been in A. fib in the past.  Evaluation in the ED he was found to have hyponatremia, EKG with A. fib with minimal ST elevation anterior leads.   Assessment & Plan:   Principal Problem:   Hyponatremia Active Problems:   Paroxysmal atrial fibrillation (HCC)   Hypertension   Nonischemic cardiomyopathy (HCC)   1-Hyponatremia: Sodium on admission 123----131. Likely related to hypovolemia, dehydration.  Improved with IV fluids.  Continue with IV fluids for another 24 hours.   A. Fib, paroxysmal.  He report been in sinus rhythm for last 2 years.  Yesterday he had chest pain, similar to prior symptoms from A fib.  He would like to discuss option for cardioversion with cardiology.  I agree plan to consult cardiology.  Continue with xarelto. He has not missed any dose.  Continue with atenolol/   Hypertension: Continue with atenolol.   Nonischemic cardiomyopathy: Ejection fraction 40 to 50% 2017. On atenolol and Cozaar.    Leukopenia;  Mild. Monitor.  Will check B 12. Folic acid.  HIV negative.     Estimated body mass index is 32.99 kg/m as calculated from the following:   Height as of this encounter: 6\' 3"  (1.905 m).   Weight as of this encounter: 119.7 kg.   DVT prophylaxis: Xarelto Code Status: Full Code Family Communication: care discussed with patient and family who was at bedside.  Disposition Plan:  Status is: Observation  The patient remains OBS appropriate and will  d/c before 2 midnights.  Dispo: The patient is from: Home              Anticipated d/c is to: Home              Patient currently is not medically stable to d/c.   Difficult to place patient No        Consultants:  Cardiology   Procedures:  Cardioversion.   Antimicrobials:    Subjective: He is alert, report feeling better, less dizziness. Denies chest pain.  He would like his Heart back in sinus rhythm.    Objective: Vitals:   11/06/20 2115 11/06/20 2137 11/06/20 2204 11/07/20 0402  BP: 129/78  (!) 146/71 122/74  Pulse: 84  82 64  Resp: 13  16 16   Temp:  98 F (36.7 C) 98.2 F (36.8 C) 97.9 F (36.6 C)  TempSrc:  Oral Oral Oral  SpO2: 99%  99% 96%  Weight:   119.7 kg   Height:   6\' 3"  (1.905 m)     Intake/Output Summary (Last 24 hours) at 11/07/2020 0810 Last data filed at 11/07/2020 0521 Gross per 24 hour  Intake --  Output 600 ml  Net -600 ml   Filed Weights   11/06/20 1849 11/06/20 2204  Weight: 117.9 kg 119.7 kg    Examination:  General exam: Appears calm and comfortable  Respiratory system: Clear to auscultation. Respiratory effort normal. Cardiovascular system: S1 & S2 heard, IRR.  Gastrointestinal system: Abdomen is nondistended, soft and nontender. No organomegaly or masses felt. Normal bowel  sounds heard. Central nervous system: Alert and oriented.  Extremities: Symmetric 5 x 5 power.    Data Reviewed: I have personally reviewed following labs and imaging studies  CBC: Recent Labs  Lab 11/06/20 1906 11/07/20 0703  WBC 3.9* 2.6*  NEUTROABS  --  1.1*  HGB 13.7 13.4  HCT 39.5 39.0  MCV 87.0 88.0  PLT 267 213   Basic Metabolic Panel: Recent Labs  Lab 11/06/20 1906 11/07/20 0031 11/07/20 0703  NA 123* 129* 131*  K 4.1 4.1 4.3  CL 88* 93* 94*  CO2 22 26 29   GLUCOSE 115* 101* 133*  BUN 10 8 7   CREATININE 1.02 0.95 1.02  CALCIUM 9.2 9.2 9.3  MG 2.0  --   --    GFR: Estimated Creatinine Clearance: 114.1 mL/min (by C-G  formula based on SCr of 1.02 mg/dL). Liver Function Tests: No results for input(s): AST, ALT, ALKPHOS, BILITOT, PROT, ALBUMIN in the last 168 hours. No results for input(s): LIPASE, AMYLASE in the last 168 hours. No results for input(s): AMMONIA in the last 168 hours. Coagulation Profile: Recent Labs  Lab 11/06/20 1906  INR 1.2   Cardiac Enzymes: No results for input(s): CKTOTAL, CKMB, CKMBINDEX, TROPONINI in the last 168 hours. BNP (last 3 results) No results for input(s): PROBNP in the last 8760 hours. HbA1C: No results for input(s): HGBA1C in the last 72 hours. CBG: No results for input(s): GLUCAP in the last 168 hours. Lipid Profile: No results for input(s): CHOL, HDL, LDLCALC, TRIG, CHOLHDL, LDLDIRECT in the last 72 hours. Thyroid Function Tests: No results for input(s): TSH, T4TOTAL, FREET4, T3FREE, THYROIDAB in the last 72 hours. Anemia Panel: No results for input(s): VITAMINB12, FOLATE, FERRITIN, TIBC, IRON, RETICCTPCT in the last 72 hours. Sepsis Labs: No results for input(s): PROCALCITON, LATICACIDVEN in the last 168 hours.  Recent Results (from the past 240 hour(s))  Resp Panel by RT-PCR (Flu A&B, Covid) Nasopharyngeal Swab     Status: None   Collection Time: 11/06/20  8:11 PM   Specimen: Nasopharyngeal Swab; Nasopharyngeal(NP) swabs in vial transport medium  Result Value Ref Range Status   SARS Coronavirus 2 by RT PCR NEGATIVE NEGATIVE Final    Comment: (NOTE) SARS-CoV-2 target nucleic acids are NOT DETECTED.  The SARS-CoV-2 RNA is generally detectable in upper respiratory specimens during the acute phase of infection. The lowest concentration of SARS-CoV-2 viral copies this assay can detect is 138 copies/mL. A negative result does not preclude SARS-Cov-2 infection and should not be used as the sole basis for treatment or other patient management decisions. A negative result may occur with  improper specimen collection/handling, submission of specimen  other than nasopharyngeal swab, presence of viral mutation(s) within the areas targeted by this assay, and inadequate number of viral copies(<138 copies/mL). A negative result must be combined with clinical observations, patient history, and epidemiological information. The expected result is Negative.  Fact Sheet for Patients:  01/07/21  Fact Sheet for Healthcare Providers:  01/07/21  This test is no t yet approved or cleared by the [employer] FDA and  has been authorized for detection and/or diagnosis of SARS-CoV-2 by FDA under an Emergency Use Authorization (EUA). This EUA will remain  in effect (meaning this test can be used) for the duration of the COVID-19 declaration under Section 564(b)(1) of the Act, 21 U.S.C.section 360bbb-3(b)(1), unless the authorization is terminated  or revoked sooner.       Influenza A by PCR NEGATIVE NEGATIVE Final   Influenza B  by PCR NEGATIVE NEGATIVE Final    Comment: (NOTE) The Xpert Xpress SARS-CoV-2/FLU/RSV plus assay is intended as an aid in the diagnosis of influenza from Nasopharyngeal swab specimens and should not be used as a sole basis for treatment. Nasal washings and aspirates are unacceptable for Xpert Xpress SARS-CoV-2/FLU/RSV testing.  Fact Sheet for Patients: [employer]  Fact Sheet for Healthcare Providers: [employer]it  This test is not yet approved or cleared by the [place] FDA and has been authorized for detection and/or diagnosis of SARS-CoV-2 by FDA under an Emergency Use Authorization (EUA). This EUA will remain in effect (meaning this test can be used) for the duration of the COVID-19 declaration under Section 564(b)(1) of the Act, 21 U.S.C. section 360bbb-3(b)(1), unless the authorization is terminated or revoked.  Performed at [facility], [address], [place],  [place] [address]          Radiology Studies: DG Chest 2 View  Result Date: 11/06/2020 CLINICAL DATA:  Shortness of breath.  AFib. EXAM: CHEST - 2 VIEW COMPARISON:  Radiograph 04/07/2015 FINDINGS: The cardiomediastinal contours are normal. Minimal bronchial thickening. Pulmonary vasculature is normal. No consolidation, pleural effusion, or pneumothorax. Degenerative spurring in the lower thoracic spine. No acute osseous abnormalities are seen. IMPRESSION: Minimal bronchial thickening. Electronically Signed   By: Narda Rutherford M.D.   On: 11/06/2020 19:20        Scheduled Meds:  atenolol  50 mg Oral Daily   losartan  100 mg Oral Daily   pantoprazole  40 mg Oral Daily   rivaroxaban  20 mg Oral Q supper   Continuous Infusions:   LOS: 0 days    Time spent: 35 Minutes.     Alba Cory, MD [facility]   If 7PM-7AM, please contact night-coverage [email or web address]  11/07/2020, 8:10 AM

## 2020-11-08 ENCOUNTER — Observation Stay (HOSPITAL_COMMUNITY): Payer: No Typology Code available for payment source | Admitting: Anesthesiology

## 2020-11-08 ENCOUNTER — Encounter (HOSPITAL_COMMUNITY)
Admission: EM | Disposition: A | Discharge status: Home / Self Care | Payer: Self-pay | Source: Home / Self Care | Attending: Emergency Medicine

## 2020-11-08 ENCOUNTER — Encounter (HOSPITAL_COMMUNITY): Payer: Self-pay | Admitting: Family Medicine

## 2020-11-08 DIAGNOSIS — Z20822 Contact with and (suspected) exposure to covid-19: Secondary | ICD-10-CM | POA: Diagnosis not present

## 2020-11-08 DIAGNOSIS — I480 Paroxysmal atrial fibrillation: Secondary | ICD-10-CM

## 2020-11-08 DIAGNOSIS — I428 Other cardiomyopathies: Secondary | ICD-10-CM

## 2020-11-08 DIAGNOSIS — I10 Essential (primary) hypertension: Secondary | ICD-10-CM | POA: Diagnosis not present

## 2020-11-08 DIAGNOSIS — E871 Hypo-osmolality and hyponatremia: Secondary | ICD-10-CM | POA: Diagnosis not present

## 2020-11-08 DIAGNOSIS — I4891 Unspecified atrial fibrillation: Secondary | ICD-10-CM | POA: Diagnosis not present

## 2020-11-08 HISTORY — PX: CARDIOVERSION: SHX1299

## 2020-11-08 LAB — BASIC METABOLIC PANEL
Anion gap: 9 (ref 5–15)
BUN: 11 mg/dL (ref 6–20)
CO2: 27 mmol/L (ref 22–32)
Calcium: 9.2 mg/dL (ref 8.9–10.3)
Chloride: 96 mmol/L — ABNORMAL LOW (ref 98–111)
Creatinine, Ser: 0.87 mg/dL (ref 0.61–1.24)
GFR, Estimated: >60 mL/min (ref >60)
Glucose, Bld: 108 mg/dL — ABNORMAL HIGH (ref 70–99)
Potassium: 4.3 mmol/L (ref 3.5–5.1)
Sodium: 132 mmol/L — ABNORMAL LOW (ref 135–145)

## 2020-11-08 LAB — CBC WITH DIFFERENTIAL/PLATELET
Abs Immature Granulocytes: 0.01 10*3/uL (ref 0.00–0.07)
Basophils Absolute: 0 10*3/uL (ref 0.0–0.1)
Basophils Relative: 0 %
Eosinophils Absolute: 0 10*3/uL (ref 0.0–0.5)
Eosinophils Relative: 1 %
HCT: 36.4 % — ABNORMAL LOW (ref 39.0–52.0)
Hemoglobin: 12.7 g/dL — ABNORMAL LOW (ref 13.0–17.0)
Immature Granulocytes: 0 %
Lymphocytes Relative: 38 %
Lymphs Abs: 1.6 10*3/uL (ref 0.7–4.0)
MCH: 31.4 pg (ref 26.0–34.0)
MCHC: 34.9 g/dL (ref 30.0–36.0)
MCV: 89.9 fL (ref 80.0–100.0)
Monocytes Absolute: 0.6 10*3/uL (ref 0.1–1.0)
Monocytes Relative: 14 %
Neutro Abs: 2 10*3/uL (ref 1.7–7.7)
Neutrophils Relative %: 47 %
Platelets: 195 10*3/uL (ref 150–400)
RBC: 4.05 MIL/uL — ABNORMAL LOW (ref 4.22–5.81)
RDW: 15.1 % (ref 11.5–15.5)
WBC: 4.2 10*3/uL (ref 4.0–10.5)
nRBC: 0 % (ref 0.0–0.2)

## 2020-11-08 LAB — VITAMIN B12: Vitamin B-12: 248 pg/mL (ref 180–914)

## 2020-11-08 LAB — FOLATE: Folate: 7.5 ng/mL (ref >5.9)

## 2020-11-08 SURGERY — CARDIOVERSION
Anesthesia: General

## 2020-11-08 MED ORDER — LIDOCAINE 2% (20 MG/ML) 5 ML SYRINGE
INTRAMUSCULAR | Status: DC | PRN
  Administered 2020-11-08: 100 mg via INTRAVENOUS

## 2020-11-08 MED ORDER — PROPOFOL 10 MG/ML IV BOLUS
INTRAVENOUS | Status: DC | PRN
  Administered 2020-11-08 (×2): 30 mg via INTRAVENOUS
  Administered 2020-11-08 (×2): 70 mg via INTRAVENOUS

## 2020-11-08 MED ORDER — AMIODARONE HCL 200 MG PO TABS
200.0000 mg | ORAL_TABLET | Freq: Two times a day (BID) | ORAL | Status: DC
  Administered 2020-11-08: 200 mg via ORAL
  Filled 2020-11-08: qty 1

## 2020-11-08 MED ORDER — B-12 100 MCG PO TABS: 1000.0000 ug | ORAL_TABLET | Freq: Every day | ORAL | 0 refills | Status: AC

## 2020-11-08 MED ORDER — AMIODARONE HCL 200 MG PO TABS: ORAL_TABLET | ORAL | 0 refills | Status: AC

## 2020-11-08 MED ORDER — VITAMIN B-12 1000 MCG PO TABS
1000.0000 ug | ORAL_TABLET | Freq: Every day | ORAL | Status: DC
  Administered 2020-11-08: 1000 ug via ORAL
  Filled 2020-11-08: qty 1

## 2020-11-08 NOTE — Interval H&P Note (Signed)
History and Physical Interval Note:  11/08/2020 12:10 PM  Gerald Robles  has presented today for surgery, with the diagnosis of afib.  The various methods of treatment have been discussed with the patient and family. After consideration of risks, benefits and other options for treatment, the patient has consented to  Procedure(s): CARDIOVERSION (N/A) as a surgical intervention.  The patient's history has been reviewed, patient examined, no change in status, stable for surgery.  I have reviewed the patient's chart and labs.  Questions were answered to the patient's satisfaction.    DCCV. NPO. On xarelto. No missed doses >3 weeks.   [name] T. Flora Lipps, MD, [facility]  [facility]  [address] [place], [place] [address] [phone]  12:10 PM

## 2020-11-08 NOTE — Progress Notes (Addendum)
 Progress Note  Patient Name: Rayland Blessinger Date of Encounter: 11/08/2020  [facility] Cardiologist: [facility] [place] by cardiology (Dr Humphrey) and  by EP (Dr Patel, Dr Bhave)  Subjective   Still have fatigue and SOB while in afib, no chest pain.   Inpatient Medications    Scheduled Meds:  atenolol  50 mg Oral Daily   losartan  100 mg Oral Daily   pantoprazole  40 mg Oral Daily   rivaroxaban  20 mg Oral Q supper   vitamin B-12  1,000 mcg Oral Daily   Continuous Infusions:  sodium chloride 100 mL/hr at 11/08/20 0428   PRN Meds: acetaminophen **OR** acetaminophen, HYDROcodone-acetaminophen, ondansetron **OR** ondansetron (ZOFRAN) IV   Vital Signs    Vitals:   11/07/20 1246 11/07/20 1828 11/07/20 2242 11/08/20 0500  BP: 99/73 104/79 104/70 134/77  Pulse: 78 72 81 83  Resp: 16 16 18 18  Temp: 98.3 F (36.8 C) 98.7 F (37.1 C) 98.3 F (36.8 C) 98.4 F (36.9 C)  TempSrc: Oral Oral Oral Oral  SpO2: 97% 97% 99% 100%  Weight:      Height:        Intake/Output Summary (Last 24 hours) at 11/08/2020 0900 Last data filed at 11/07/2020 1725 Gross per 24 hour  Intake 961.12 ml  Output --  Net 961.12 ml   Last 3 Weights 11/06/2020 11/06/2020 12/13/2016  Weight (lbs) 263 lb 14.4 oz 260 lb 263 lb  Weight (kg) 119.704 kg 117.935 kg 119.296 kg      Telemetry    Atrial fibrillation with HR 80s.  - Personally Reviewed  ECG    Atrial fibrillation with HR 80s - Personally Reviewed  Physical Exam   GEN: No acute distress.   Neck: No JVD Cardiac: irregularly irregular, no murmurs, rubs, or gallops.  Respiratory: Clear to auscultation bilaterally. GI: Soft, nontender, non-distended  MS: No edema; No deformity. Neuro:  Nonfocal  Psych: Normal affect   Labs    High Sensitivity Troponin:   Recent Labs  Lab 11/06/20 2006 11/06/20 2226  TROPONINIHS 13 14      Chemistry Recent Labs  Lab 11/07/20 0031 11/07/20 0703 11/08/20 0302  NA 129* 131* 132*  K 4.1 4.3 4.3   CL 93* 94* 96*  CO2 26 29 27  GLUCOSE 101* 133* 108*  BUN 8 7 11  CREATININE 0.95 1.02 0.87  CALCIUM 9.2 9.3 9.2  GFRNONAA >60 >60 >60  ANIONGAP 10 8 9     Hematology Recent Labs  Lab 11/06/20 1906 11/07/20 0703 11/08/20 0302  WBC 3.9* 2.6* 4.2  RBC 4.54 4.43 4.05*  HGB 13.7 13.4 12.7*  HCT 39.5 39.0 36.4*  MCV 87.0 88.0 89.9  MCH 30.2 30.2 31.4  MCHC 34.7 34.4 34.9  RDW 14.8 15.0 15.1  PLT 267 213 195    BNPNo results for input(s): BNP, PROBNP in the last 168 hours.   DDimer No results for input(s): DDIMER in the last 168 hours.   Radiology    DG Chest 2 View  Result Date: 11/06/2020 CLINICAL DATA:  Shortness of breath.  AFib. EXAM: CHEST - 2 VIEW COMPARISON:  Radiograph 04/07/2015 FINDINGS: The cardiomediastinal contours are normal. Minimal bronchial thickening. Pulmonary vasculature is normal. No consolidation, pleural effusion, or pneumothorax. Degenerative spurring in the lower thoracic spine. No acute osseous abnormalities are seen. IMPRESSION: Minimal bronchial thickening. Electronically Signed   By: Melanie  Sanford M.D.   On: 11/06/2020 19:20    Cardiac Studies     Echo 05/20/2015 LV EF: 45% -   50%  Study Conclusions   - Left ventricle: The cavity size was normal. There was moderate    concentric hypertrophy. Systolic function was mildly reduced. The    estimated ejection fraction was in the range of 45% to 50%.    Diffuse hypokinesis. Left ventricular diastolic function    parameters were normal.  - Left atrium: The atrium was severely dilated.  - Right ventricle: The cavity size was mildly dilated. Wall    thickness was normal. Systolic function was normal.  - Right atrium: The atrium was mildly dilated.  - Inferior vena cava: The vessel was dilated. The respirophasic    diameter changes were blunted (< 50%), consistent with elevated    central venous pressure.   Patient Profile     55 y.o. male with PMH of PAF s/p cardioversion 2016 and ablation  2020, sarcoid, HTN, NICM, and EtOH abuse presented with recurrent afib.    Assessment & Plan    PAF  - first recurrence since ablation in 2020.   - no missed doses of Xarelto in the past 3 weeks, while HR in afib is in the 80s, however patient is symptomatic with SOB, dizziness and fatigue.   - will proceed with DCCV today at 12:30PM by Dr. Flora Lipps. Risk and benefit of the procedure has been explained to the patient, he is agreeable to proceed.   - will defer to MD to see if repeat echo is needed. Last echo in 2017 showed EF 45-50%. Otherwise, if able to hold sinus rhythm for more than 2 hours after cardioversion, may be able to discharge later this afternoon.   NICM: EF 45-50% on echo in 2017. Continue atenolol and losartan  HTN  EtOH abuse: need to stop drinking  Hyponatremia: need to cut back on EtOH      For questions or updates, please contact [facility] Please consult [email or web address] for contact info under        Signed, Azalee Course, PA  11/08/2020, 9:00 AM    Patient seen and examined and agree with Azalee Course, PA as detailed above.  In brief, the patient is a 55 y.o. male with PMH of PAF s/p cardioversion 2016 and ablation 2020, sarcoid, HTN, NICM, and EtOH abuse presented with recurrent afib for which Cardiology has been consulted.  Remains in Afib this AM and has been compliant with xarelto for the past 3 weeks without skipping doses. Was dry on admission requiring IVF without signs of acute on chronic HF exacerbation. Plan for DCCV today.  GEN: No acute distress.   Neck: No JVD Cardiac: RRR, no murmurs, rubs, or gallops.  Respiratory: Clear to auscultation bilaterally. GI: Soft, nontender, non-distended  MS: No edema; No deformity. Neuro:  Nonfocal  Psych: Normal affect    Plan: -Plan for DCCV today -Has been compliant with xarelto without missing doses -Hypovolemic on admission without evidence of acute on chronic heart failure exacerbation -Continue losartan  100mg  daily and home atenolol 50mg  daily -Management of hypovolemic hyponatremia per primary  , MD

## 2020-11-08 NOTE — Evaluation (Signed)
Physical Therapy Evaluation and Discharge Patient Details Name: Gerald Robles MRN: [id number] DOB: [birth year] Today's Date: 11/08/2020   History of Present Illness  Gerald Robles is a 55 y.o. male presenting to the emergency department with lightheadedness, nausea, and malaise.  Patient reports that [place], spending days outside in the heat, and has developed nausea, lightheadedness, general malaise, and has had a couple episodes of vomiting but no abdominal pain or diarrhea.  He has felt as though he might pass out a couple times but has not lost consciousness.  He had a fleeting episode of chest discomfort yesterday that felt similar to when he has been in A. fib in the past; Cardiology considering cardioversion; with medical history significant for atrial fibrillation on Xarelto, hypertension, and nonischemic cardiomyopathy  Clinical Impression   Patient evaluated by Physical Therapy with no further acute PT needs identified. All education has been completed and the patient has no further questions. Walked in the hallway without difficulty, HR ranged 80s to 110s, afib per Central Telemetry Monitoring;   See below for any follow-up Physical Therapy or equipment needs. PT is signing off. Thank you for this referral.     Follow Up Recommendations No PT follow up    Equipment Recommendations  None recommended by PT    Recommendations for Other Services       Precautions / Restrictions Precautions Precautions: None Precaution Comments: Pt self-monitors well      Mobility  Bed Mobility Overal bed mobility: Independent                  Transfers Overall transfer level: Independent                  Ambulation/Gait Ambulation/Gait assistance: Independent Gait Distance (Feet): 500 Feet Assistive device: None Gait Pattern/deviations: WFL(Within Functional Limits) Gait velocity: WFL   General Gait Details:  Self-monitors for activity tolerance  well  Stairs            Wheelchair Mobility    Modified Rankin (Stroke Patients Only)       Balance Overall balance assessment: Independent                                           Pertinent Vitals/Pain Pain Assessment: No/denies pain    Home Living Family/patient expects to be discharged to:: Private residence Living Arrangements: Spouse/significant other Available Help at Discharge: Family Type of Home: House Home Access: Stairs to enter Entrance Stairs-Rails: Right Entrance Stairs-Number of Steps: 4 Home Layout: One level        Prior Function Level of Independence: Independent               Hand Dominance        Extremity/Trunk Assessment   Upper Extremity Assessment Upper Extremity Assessment: Overall WFL for tasks assessed    Lower Extremity Assessment Lower Extremity Assessment: Overall WFL for tasks assessed    Cervical / Trunk Assessment Cervical / Trunk Assessment: Normal  Communication   Communication: No difficulties  Cognition Arousal/Alertness: Awake/alert Behavior During Therapy: WFL for tasks assessed/performed Overall Cognitive Status: Within Functional Limits for tasks assessed  General Comments General comments (skin integrity, edema, etc.): Managing well    Exercises     Assessment/Plan    PT Assessment Patent does not need any further PT services  PT Problem List         PT Treatment Interventions      PT Goals (Current goals can be found in the Care Plan section)  Acute Rehab PT Goals Patient Stated Goal: Hopes to be home soon; plans to return to the gym when appropriate PT Goal Formulation: All assessment and education complete, DC therapy    Frequency     Barriers to discharge        Co-evaluation               AM-PAC PT "6 Clicks" Mobility  Outcome Measure Help needed turning  from your back to your side while in a flat bed without using bedrails?: None Help needed moving from lying on your back to sitting on the side of a flat bed without using bedrails?: None Help needed moving to and from a bed to a chair (including a wheelchair)?: None Help needed standing up from a chair using your arms (e.g., wheelchair or bedside chair)?: None Help needed to walk in hospital room?: None Help needed climbing 3-5 steps with a railing? : None 6 Click Score: 24    End of Session   Activity Tolerance: Patient tolerated treatment well Patient left: Other (comment) (Managing independently in room) Nurse Communication: Mobility status PT Visit Diagnosis: Dizziness and giddiness (R42)    Time: 0947-0962 PT Time Calculation (min) (ACUTE ONLY): 20 min   Charges:   PT Evaluation $PT Eval Low Complexity: 1 Low          Van Clines, PT  Acute Rehabilitation Services Pager [phone] Office [phone]'   [name] 11/08/2020, 9:41 AM

## 2020-11-08 NOTE — Anesthesia Postprocedure Evaluation (Signed)
Anesthesia Post Note  Patient: Gerald Robles  Procedure(s) Performed: CARDIOVERSION     Patient location during evaluation: Endoscopy Anesthesia Type: General Level of consciousness: awake and alert Pain management: pain level controlled Vital Signs Assessment: post-procedure vital signs reviewed and stable Respiratory status: spontaneous breathing, nonlabored ventilation and respiratory function stable Cardiovascular status: blood pressure returned to baseline and stable Postop Assessment: no apparent nausea or vomiting Anesthetic complications: no   No notable events documented.  Last Vitals:  Vitals:   11/08/20 1250 11/08/20 1300  BP: (!) 137/97 (!) 141/84  Pulse: 83 70  Resp: 15 11  Temp:    SpO2: 99% 100%    Last Pain:  Vitals:   11/08/20 1300  TempSrc:   PainSc: 0-No pain                 Lucretia Kern

## 2020-11-08 NOTE — Anesthesia Preprocedure Evaluation (Signed)
Anesthesia Evaluation  Patient identified by MRN, date of birth, ID band Patient awake    Reviewed: Allergy & Precautions, NPO status , Patient's Chart, lab work & pertinent test results, reviewed documented beta blocker date and time   History of Anesthesia Complications Negative for: history of anesthetic complications  Airway Mallampati: II  TM Distance: >3 FB Neck ROM: Full    Dental   Pulmonary  sarcoidosis   Pulmonary exam normal        Cardiovascular hypertension, Pt. on medications and Pt. on home beta blockers +CHF  + dysrhythmias Atrial Fibrillation  Rhythm:Irregular     Neuro/Psych negative neurological ROS     GI/Hepatic negative GI ROS, (+)     substance abuse  alcohol use,   Endo/Other  negative endocrine ROS  Renal/GU negative Renal ROS  negative genitourinary   Musculoskeletal  (+) Arthritis ,   Abdominal   Peds  Hematology  (+) anemia ,   Anesthesia Other Findings   Reproductive/Obstetrics                            Anesthesia Physical Anesthesia Plan  ASA: 3  Anesthesia Plan: General   Post-op Pain Management:    Induction: Intravenous  PONV Risk Score and Plan: TIVA and Treatment may vary due to age or medical condition  Airway Management Planned: Mask  Additional Equipment: None  Intra-op Plan:   Post-operative Plan:   Informed Consent: I have reviewed the patients History and Physical, chart, labs and discussed the procedure including the risks, benefits and alternatives for the proposed anesthesia with the patient or authorized representative who has indicated his/her understanding and acceptance.       Plan Discussed with:   Anesthesia Plan Comments:         Anesthesia Quick Evaluation

## 2020-11-08 NOTE — Progress Notes (Signed)
Patient has ordered for discharge. Given discharge instructions with paper to the patient and family. Iv removed. Given all belongings to the patient and family. 

## 2020-11-08 NOTE — Progress Notes (Signed)
Patient back to the unit from the procedure. Patient is alert and oriented x4. Vital signs are stable.

## 2020-11-08 NOTE — Transfer of Care (Signed)
Immediate Anesthesia Transfer of Care Note  Patient: Gerald Robles  Procedure(s) Performed: CARDIOVERSION  Patient Location: PACU  Anesthesia Type:General  Level of Consciousness: awake, alert  and oriented  Airway & Oxygen Therapy: Patient Spontanous Breathing and Patient connected to face mask oxygen  Post-op Assessment: Report given to RN, Post -op Vital signs reviewed and stable and Patient moving all extremities  Post vital signs: Reviewed and stable  Last Vitals:  Vitals Value Taken Time  BP    Temp    Pulse    Resp    SpO2      Last Pain:  Vitals:   11/08/20 1154  TempSrc: Oral  PainSc: 0-No pain         Complications: No notable events documented.

## 2020-11-08 NOTE — H&P (View-Only) (Signed)
Progress Note  Patient Name: Gerald Robles Date of Encounter: 11/08/2020  [facility] Cardiologist: [facility] [place] by [facility] (Dr Andee Poles) and  by EP (Dr Allena Katz, Dr Lowella Bandy)  Subjective   Still have fatigue and SOB while in afib, no chest pain.   Inpatient Medications    Scheduled Meds:  atenolol  50 mg Oral Daily   losartan  100 mg Oral Daily   pantoprazole  40 mg Oral Daily   rivaroxaban  20 mg Oral Q supper   vitamin B-12  1,000 mcg Oral Daily   Continuous Infusions:  sodium chloride 100 mL/hr at 11/08/20 0428   PRN Meds: acetaminophen **OR** acetaminophen, HYDROcodone-acetaminophen, ondansetron **OR** ondansetron (ZOFRAN) IV   Vital Signs    Vitals:   11/07/20 1246 11/07/20 1828 11/07/20 2242 11/08/20 0500  BP: 99/73 104/79 104/70 134/77  Pulse: 78 72 81 83  Resp: 16 16 18 18   Temp: 98.3 F (36.8 C) 98.7 F (37.1 C) 98.3 F (36.8 C) 98.4 F (36.9 C)  TempSrc: Oral Oral Oral Oral  SpO2: 97% 97% 99% 100%  Weight:      Height:        Intake/Output Summary (Last 24 hours) at 11/08/2020 0900 Last data filed at 11/07/2020 1725 Gross per 24 hour  Intake 961.12 ml  Output --  Net 961.12 ml   Last 3 Weights 11/06/2020 11/06/2020 12/13/2016  Weight (lbs) 263 lb 14.4 oz 260 lb 263 lb  Weight (kg) 119.704 kg 117.935 kg 119.296 kg      Telemetry    Atrial fibrillation with HR 80s.  - Personally Reviewed  ECG    Atrial fibrillation with HR 80s - Personally Reviewed  Physical Exam   GEN: No acute distress.   Neck: No JVD Cardiac: irregularly irregular, no murmurs, rubs, or gallops.  Respiratory: Clear to auscultation bilaterally. GI: Soft, nontender, non-distended  MS: No edema; No deformity. Neuro:  Nonfocal  Psych: Normal affect   Labs    High Sensitivity Troponin:   Recent Labs  Lab 11/06/20 2006 11/06/20 2226  TROPONINIHS 13 14      Chemistry Recent Labs  Lab 11/07/20 0031 11/07/20 0703 11/08/20 0302  NA 129* 131* 132*  K 4.1 4.3 4.3   CL 93* 94* 96*  CO2 26 29 27   GLUCOSE 101* 133* 108*  BUN 8 7 11   CREATININE 0.95 1.02 0.87  CALCIUM 9.2 9.3 9.2  GFRNONAA >60 >60 >60  ANIONGAP 10 8 9      Hematology Recent Labs  Lab 11/06/20 1906 11/07/20 0703 11/08/20 0302  WBC 3.9* 2.6* 4.2  RBC 4.54 4.43 4.05*  HGB 13.7 13.4 12.7*  HCT 39.5 39.0 36.4*  MCV 87.0 88.0 89.9  MCH 30.2 30.2 31.4  MCHC 34.7 34.4 34.9  RDW 14.8 15.0 15.1  PLT 267 213 195    BNPNo results for input(s): BNP, PROBNP in the last 168 hours.   DDimer No results for input(s): DDIMER in the last 168 hours.   Radiology    DG Chest 2 View  Result Date: 11/06/2020 CLINICAL DATA:  Shortness of breath.  AFib. EXAM: CHEST - 2 VIEW COMPARISON:  Radiograph 04/07/2015 FINDINGS: The cardiomediastinal contours are normal. Minimal bronchial thickening. Pulmonary vasculature is normal. No consolidation, pleural effusion, or pneumothorax. Degenerative spurring in the lower thoracic spine. No acute osseous abnormalities are seen. IMPRESSION: Minimal bronchial thickening. Electronically Signed   By: 01/08/21 M.D.   On: 11/06/2020 19:20    Cardiac Studies  Echo 05/20/2015 LV EF: 45% -   50%  Study Conclusions   - Left ventricle: The cavity size was normal. There was moderate    concentric hypertrophy. Systolic function was mildly reduced. The    estimated ejection fraction was in the range of 45% to 50%.    Diffuse hypokinesis. Left ventricular diastolic function    parameters were normal.  - Left atrium: The atrium was severely dilated.  - Right ventricle: The cavity size was mildly dilated. Wall    thickness was normal. Systolic function was normal.  - Right atrium: The atrium was mildly dilated.  - Inferior vena cava: The vessel was dilated. The respirophasic    diameter changes were blunted (< 50%), consistent with elevated    central venous pressure.   Patient Profile     55 y.o. male with PMH of PAF s/p cardioversion 2016 and ablation  2020, sarcoid, HTN, NICM, and EtOH abuse presented with recurrent afib.    Assessment & Plan    PAF  - first recurrence since ablation in 2020.   - no missed doses of Xarelto in the past 3 weeks, while HR in afib is in the 80s, however patient is symptomatic with SOB, dizziness and fatigue.   - will proceed with DCCV today at 12:30PM by Dr. Flora Lipps. Risk and benefit of the procedure has been explained to the patient, he is agreeable to proceed.   - will defer to MD to see if repeat echo is needed. Last echo in 2017 showed EF 45-50%. Otherwise, if able to hold sinus rhythm for more than 2 hours after cardioversion, may be able to discharge later this afternoon.   NICM: EF 45-50% on echo in 2017. Continue atenolol and losartan  HTN  EtOH abuse: need to stop drinking  Hyponatremia: need to cut back on EtOH      For questions or updates, please contact [facility] Please consult [email or web address] for contact info under        Signed, Azalee Course, PA  11/08/2020, 9:00 AM    Patient seen and examined and agree with Azalee Course, PA as detailed above.  In brief, the patient is a 55 y.o. male with PMH of PAF s/p cardioversion 2016 and ablation 2020, sarcoid, HTN, NICM, and EtOH abuse presented with recurrent afib for which Cardiology has been consulted.  Remains in Afib this AM and has been compliant with xarelto for the past 3 weeks without skipping doses. Was dry on admission requiring IVF without signs of acute on chronic HF exacerbation. Plan for DCCV today.  GEN: No acute distress.   Neck: No JVD Cardiac: RRR, no murmurs, rubs, or gallops.  Respiratory: Clear to auscultation bilaterally. GI: Soft, nontender, non-distended  MS: No edema; No deformity. Neuro:  Nonfocal  Psych: Normal affect    Plan: -Plan for DCCV today -Has been compliant with xarelto without missing doses -Hypovolemic on admission without evidence of acute on chronic heart failure exacerbation -Continue losartan  100mg  daily and home atenolol 50mg  daily -Management of hypovolemic hyponatremia per primary  , MD

## 2020-11-08 NOTE — Discharge Summary (Signed)
Physician Discharge Summary  Gerald Robles BUL:[id number] DOB: 12/26/1965 DOA: 11/06/2020  PCP: Marden Noble, MD  Admit date: 11/06/2020 Discharge date: 11/08/2020  Admitted From: Home Disposition: Home   Recommendations for Outpatient Follow-up:  Follow up with PCP in 1-2 weeks Please obtain BMP/CBC in one week Needs to follow up with Primary cardiologist/ A fib clinic.  Needs HbA1c for screening.  Needs repeat Sodium level.  Needs CBC to follow up on leukopenia.   Home Health: None  Discharge Condition: Stable.  CODE STATUS:Full Code Diet recommendation: Heart Healthy   Brief/Interim Summary: 55 year old with past medical history significant for A. fib on Xarelto, hypertension, nonischemic cardiomyopathy who presents complaining of lightheadedness, nausea and malaise.  He has been spending his days outside in the heat, when he developed nausea lightheadedness general malaise, had a couple of episode of vomiting.  He has episode of chest discomfort similar when he has been in A. fib in the past.   Evaluation in the ED he was found to have hyponatremia, EKG with A. fib with minimal ST elevation anterior leads.    1-Hyponatremia: Sodium on admission 123----131--132. Likely related to hypovolemia, dehydration. Improved with IV fluids. Stable for discharge/    A. Fib, paroxysmal. He report been in sinus rhythm for last 2 years. Yesterday he had chest pain, similar to prior symptoms from A fib. Continue with xarelto. He has not missed any dose. Continue with atenolol/  Evaluated by cardiologist. Underwent cardioversion, unsuccessful 7/05.  Patient was started on amiodarone, 200 mg BID then 200 mg daily.  Ok to discharge home today per cardiologist.   Hypertension: Continue with atenolol Cozaar.    Nonischemic cardiomyopathy: Ejection fraction 40 to 50% 2017. On atenolol and Cozaar.     Leukopenia; Mild. Monitor. B 12: 248 started supplement. . Folic acid normal.  HIV  negative.  WBC normalized.  Needs repeat CBC out patient.   Discharge Diagnoses:  Principal Problem:   Hyponatremia Active Problems:   Paroxysmal atrial fibrillation (HCC)   Hypertension   Nonischemic cardiomyopathy [facility])    Discharge Instructions  Discharge Instructions     Amb referral to AFIB Clinic   Complete by: As directed    Diet - low sodium heart healthy   Complete by: As directed    Increase activity slowly   Complete by: As directed       Allergies as of 11/08/2020   No Known Allergies      Medication List     STOP taking these medications    meloxicam 15 MG tablet Commonly known as: MOBIC       TAKE these medications    amiodarone 200 MG tablet Commonly known as: PACERONE Take 1 tablet twice a day for 14 days, then take 1 tablet daily   atenolol 50 MG tablet Commonly known as: TENORMIN Take 50 mg by mouth daily.   B-12 100 MCG Tabs Take 1,000 mcg by mouth daily. Start taking on: November 09, 2020   Fish Oil 1000 MG Caps Take 2 capsules by mouth daily.   losartan 100 MG tablet Commonly known as: COZAAR Take 100 mg by mouth daily.   Melatonin 3 MG Caps Take 3 mg by mouth at bedtime as needed for sleep.   multivitamin capsule Take 1 capsule by mouth daily.   omeprazole 20 MG capsule Commonly known as: PRILOSEC Take 20 mg by mouth daily.   rivaroxaban 20 MG Tabs tablet Commonly known as: XARELTO Take 20 mg by mouth daily with  supper.        No Known Allergies  Consultations: Cardiology   Procedures/Studies: DG Chest 2 View  Result Date: 11/06/2020 CLINICAL DATA:  Shortness of breath.  AFib. EXAM: CHEST - 2 VIEW COMPARISON:  Radiograph 04/07/2015 FINDINGS: The cardiomediastinal contours are normal. Minimal bronchial thickening. Pulmonary vasculature is normal. No consolidation, pleural effusion, or pneumothorax. Degenerative spurring in the lower thoracic spine. No acute osseous abnormalities are seen. IMPRESSION: Minimal  bronchial thickening. Electronically Signed   By: Narda Rutherford M.D.   On: 11/06/2020 19:20     Subjective: He is feeling better.   Discharge Exam: Vitals:   11/08/20 1300 11/08/20 1355  BP: (!) 141/84 127/88  Pulse: 70 64  Resp: 11 16  Temp:  97.6 F (36.4 C)  SpO2: 100% 99%     General: Pt is alert, awake, not in acute distress Cardiovascular: RRR, S1/S2 +, no rubs, no gallops Respiratory: CTA bilaterally, no wheezing, no rhonchi Abdominal: Soft, NT, ND, bowel sounds + Extremities: no edema, no cyanosis    The results of significant diagnostics from this hospitalization (including imaging, microbiology, ancillary and laboratory) are listed below for reference.     Microbiology: Recent Results (from the past 240 hour(s))  Resp Panel by RT-PCR (Flu A&B, Covid) Nasopharyngeal Swab     Status: None   Collection Time: 11/06/20  8:11 PM   Specimen: Nasopharyngeal Swab; Nasopharyngeal(NP) swabs in vial transport medium  Result Value Ref Range Status   SARS Coronavirus 2 by RT PCR NEGATIVE NEGATIVE Final    Comment: (NOTE) SARS-CoV-2 target nucleic acids are NOT DETECTED.  The SARS-CoV-2 RNA is generally detectable in upper respiratory specimens during the acute phase of infection. The lowest concentration of SARS-CoV-2 viral copies this assay can detect is 138 copies/mL. A negative result does not preclude SARS-Cov-2 infection and should not be used as the sole basis for treatment or other patient management decisions. A negative result may occur with  improper specimen collection/handling, submission of specimen other than nasopharyngeal swab, presence of viral mutation(s) within the areas targeted by this assay, and inadequate number of viral copies(<138 copies/mL). A negative result must be combined with clinical observations, patient history, and epidemiological information. The expected result is Negative.  Fact Sheet for Patients:   BloggerCourse.com  Fact Sheet for Healthcare Providers:  SeriousBroker.it  This test is no t yet approved or cleared by the [place] FDA and  has been authorized for detection and/or diagnosis of SARS-CoV-2 by FDA under an Emergency Use Authorization (EUA). This EUA will remain  in effect (meaning this test can be used) for the duration of the COVID-19 declaration under Section 564(b)(1) of the Act, 21 U.S.C.section 360bbb-3(b)(1), unless the authorization is terminated  or revoked sooner.       Influenza A by PCR NEGATIVE NEGATIVE Final   Influenza B by PCR NEGATIVE NEGATIVE Final    Comment: (NOTE) The Xpert Xpress SARS-CoV-2/FLU/RSV plus assay is intended as an aid in the diagnosis of influenza from Nasopharyngeal swab specimens and should not be used as a sole basis for treatment. Nasal washings and aspirates are unacceptable for Xpert Xpress SARS-CoV-2/FLU/RSV testing.  Fact Sheet for Patients: BloggerCourse.com  Fact Sheet for Healthcare Providers: SeriousBroker.it  This test is not yet approved or cleared by the [place] FDA and has been authorized for detection and/or diagnosis of SARS-CoV-2 by FDA under an Emergency Use Authorization (EUA). This EUA will remain in effect (meaning this test can be used) for the  duration of the COVID-19 declaration under Section 564(b)(1) of the Act, 21 U.S.C. section 360bbb-3(b)(1), unless the authorization is terminated or revoked.  Performed at [facility], [address] [place], [place] [address]      Labs: BNP (last 3 results) No results for input(s): BNP in the last 8760 hours. Basic Metabolic Panel: Recent Labs  Lab 11/06/20 1906 11/07/20 0031 11/07/20 0703 11/08/20 0302  NA 123* 129* 131* 132*  K 4.1 4.1 4.3 4.3  CL 88* 93* 94* 96*  CO2 22 26 29 27   GLUCOSE 115* 101* 133* 108*  BUN 10 8 7 11    CREATININE 1.02 0.95 1.02 0.87  CALCIUM 9.2 9.2 9.3 9.2  MG 2.0  --   --   --    Liver Function Tests: No results for input(s): AST, ALT, ALKPHOS, BILITOT, PROT, ALBUMIN in the last 168 hours. No results for input(s): LIPASE, AMYLASE in the last 168 hours. No results for input(s): AMMONIA in the last 168 hours. CBC: Recent Labs  Lab 11/06/20 1906 11/07/20 0703 11/08/20 0302  WBC 3.9* 2.6* 4.2  NEUTROABS  --  1.1* 2.0  HGB 13.7 13.4 12.7*  HCT 39.5 39.0 36.4*  MCV 87.0 88.0 89.9  PLT 267 213 195   Cardiac Enzymes: No results for input(s): CKTOTAL, CKMB, CKMBINDEX, TROPONINI in the last 168 hours. BNP: Invalid input(s): POCBNP CBG: No results for input(s): GLUCAP in the last 168 hours. D-Dimer No results for input(s): DDIMER in the last 72 hours. Hgb A1c No results for input(s): HGBA1C in the last 72 hours. Lipid Profile No results for input(s): CHOL, HDL, LDLCALC, TRIG, CHOLHDL, LDLDIRECT in the last 72 hours. Thyroid function studies No results for input(s): TSH, T4TOTAL, T3FREE, THYROIDAB in the last 72 hours.  Invalid input(s): FREET3 Anemia work up Recent Labs    11/08/20 0302  VITAMINB12 248  FOLATE 7.5   Urinalysis    Component Value Date/Time   COLORURINE YELLOW 11/07/2020 0525   APPEARANCEUR CLEAR 11/07/2020 0525   LABSPEC 1.005 11/07/2020 0525   PHURINE 6.0 11/07/2020 0525   GLUCOSEU NEGATIVE 11/07/2020 0525   HGBUR SMALL (A) 11/07/2020 0525   BILIRUBINUR NEGATIVE 11/07/2020 0525   KETONESUR 20 (A) 11/07/2020 0525   PROTEINUR NEGATIVE 11/07/2020 0525   NITRITE NEGATIVE 11/07/2020 0525   LEUKOCYTESUR NEGATIVE 11/07/2020 0525   Sepsis Labs Invalid input(s): PROCALCITONIN,  WBC,  LACTICIDVEN Microbiology Recent Results (from the past 240 hour(s))  Resp Panel by RT-PCR (Flu A&B, Covid) Nasopharyngeal Swab     Status: None   Collection Time: 11/06/20  8:11 PM   Specimen: Nasopharyngeal Swab; Nasopharyngeal(NP) swabs in vial transport medium   Result Value Ref Range Status   SARS Coronavirus 2 by RT PCR NEGATIVE NEGATIVE Final    Comment: (NOTE) SARS-CoV-2 target nucleic acids are NOT DETECTED.  The SARS-CoV-2 RNA is generally detectable in upper respiratory specimens during the acute phase of infection. The lowest concentration of SARS-CoV-2 viral copies this assay can detect is 138 copies/mL. A negative result does not preclude SARS-Cov-2 infection and should not be used as the sole basis for treatment or other patient management decisions. A negative result may occur with  improper specimen collection/handling, submission of specimen other than nasopharyngeal swab, presence of viral mutation(s) within the areas targeted by this assay, and inadequate number of viral copies(<138 copies/mL). A negative result must be combined with clinical observations, patient history, and epidemiological information. The expected result is Negative.  Fact Sheet for Patients:  BloggerCourse.com  Fact Sheet for Healthcare Providers:  SeriousBroker.it  This test is no t yet approved or cleared by the [place] FDA and  has been authorized for detection and/or diagnosis of SARS-CoV-2 by FDA under an Emergency Use Authorization (EUA). This EUA will remain  in effect (meaning this test can be used) for the duration of the COVID-19 declaration under Section 564(b)(1) of the Act, 21 U.S.C.section 360bbb-3(b)(1), unless the authorization is terminated  or revoked sooner.       Influenza A by PCR NEGATIVE NEGATIVE Final   Influenza B by PCR NEGATIVE NEGATIVE Final    Comment: (NOTE) The Xpert Xpress SARS-CoV-2/FLU/RSV plus assay is intended as an aid in the diagnosis of influenza from Nasopharyngeal swab specimens and should not be used as a sole basis for treatment. Nasal washings and aspirates are unacceptable for Xpert Xpress SARS-CoV-2/FLU/RSV testing.  Fact Sheet for  Patients: BloggerCourse.com  Fact Sheet for Healthcare Providers: SeriousBroker.it  This test is not yet approved or cleared by the [place] FDA and has been authorized for detection and/or diagnosis of SARS-CoV-2 by FDA under an Emergency Use Authorization (EUA). This EUA will remain in effect (meaning this test can be used) for the duration of the COVID-19 declaration under Section 564(b)(1) of the Act, 21 U.S.C. section 360bbb-3(b)(1), unless the authorization is terminated or revoked.  Performed at [facility], [address] [place], [place] [address]      Time coordinating discharge: 40 minutes  SIGNED:   Alba Cory, MD  [facility]

## 2020-11-08 NOTE — CV Procedure (Signed)
   DIRECT CURRENT CARDIOVERSION  NAME:  Gerald Robles    MRN: [id number] DOB:  [birth year]    ADMIT DATE: 11/06/2020  Indication:  Symptomatic atrial fibrillation  Procedure Note:  The patient signed informed consent.  They have had had therapeutic anticoagulation with xarelto greater than 3 weeks.  Anesthesia was administered by Dr. Darlin Drop.  Adequate airway was maintained throughout and vital followed per protocol.  They were cardioverted x 3 with 200J of biphasic synchronized energy.  The patient did not convert to NSR at all despite 3 attempts with pad repositioning and new pads.  There were no apparent complications.  The patient had normal neuro status and respiratory status post procedure with vitals stable as recorded elsewhere.    Follow up: They will continue on current medical therapy and follow up with cardiology as scheduled.  [name] T. Flora Lipps, MD, [facility]  [facility]  [address] [place], [place] [address] [phone]  12:39 PM

## 2023-03-27 ENCOUNTER — Other Ambulatory Visit: Payer: Self-pay

## 2023-03-27 ENCOUNTER — Encounter: Payer: Self-pay | Admitting: Speech Pathology

## 2023-03-27 ENCOUNTER — Ambulatory Visit: Payer: No Typology Code available for payment source | Attending: Internal Medicine | Admitting: Speech Pathology

## 2023-03-27 DIAGNOSIS — R4701 Aphasia: Secondary | ICD-10-CM | POA: Diagnosis present

## 2023-03-27 NOTE — Therapy (Signed)
OUTPATIENT SPEECH LANGUAGE PATHOLOGY APHASIA EVALUATION   Patient Name: Gerald Robles MRN: [id number] DOB:24-Sep-1965, 57 y.o., male Today's Date: 03/27/2023  PCP: Marden Noble MD REFERRING PROVIDER: Marlowe Shores, MD  END OF SESSION:  End of Session - 03/27/23 1555     Visit Number 1    Number of Visits 25    Date for SLP Re-Evaluation 06/19/23    Authorization Type [facility] - 15 visits    Authorization Time Period 03/01/23-08/28/23    Authorization - Visit Number 1    Authorization - Number of Visits 15    SLP Start Time 1400    SLP Stop Time  1445    SLP Time Calculation (min) 45 min    Activity Tolerance Patient tolerated treatment well             Past Medical History:  Diagnosis Date   A-fib (HCC)    Anemia    Anemia    Arthritis    ED (erectile dysfunction)    Hypertension    Sarcoidosis 2003   Past Surgical History:  Procedure Laterality Date   CARDIOVERSION N/A 11/08/2020   Procedure: CARDIOVERSION;  Surgeon: Sande Rives, MD;  Location: [facility] ENDOSCOPY;  Service: Cardiovascular;  Laterality: N/A;   KNEE ARTHROPLASTY Left 08/09/2016   Procedure: LEFT TOTAL KNEE ARTHROPLASTY WITH COMPUTER NAVIGATION;  Surgeon: Samson Frederic, MD;  Location: [facility] ORS;  Service: Orthopedics;  Laterality: Left;  Needs RNFA   KNEE ARTHROPLASTY Right 12/13/2016   Procedure: RIGHT TOTAL KNEE ARTHROPLASTY WITH COMPUTER NAVIGATION;  Surgeon: Samson Frederic, MD;  Location: [facility] ORS;  Service: Orthopedics;  Laterality: Right;  Needs RNFA   KNEE SURGERY Bilateral 1990   right acl with arthsroscopy; left kee artshroscopy    Right total knee     12/13/16 Dr. Linna Caprice   SHOULDER SURGERY Right    twice   VASECTOMY     Patient Active Problem List   Diagnosis Date Noted   Hyponatremia 11/06/2020   Hypertension    Nonischemic cardiomyopathy (HCC)    Post-traumatic osteoarthritis of right knee 12/13/2016   Osteoarthritis of left knee 08/09/2016   Paroxysmal atrial fibrillation (HCC)  05/04/2015    ONSET DATE: 03/18/2023 (referral date)  REFERRING DIAG:  Diagnosis  I63.9 (ICD-10-CM) - Cerebral infarction, unspecified    THERAPY DIAG:  Aphasia  Rationale for Evaluation and Treatment: Rehabilitation  SUBJECTIVE:   SUBJECTIVE STATEMENT: "I kknow it, I can't get it out" Pt accompanied by: significant other  PERTINENT HISTORY: The patient was admitted to [facility] 3/27-4/16/2024, initially presenting with dyspnea and hypoxia due to pneumonia, with hospitalization complicated by 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 His home rivaroxaban had been held the day prior due to a planned bronchoscopy. Follow-up MRI brain from 3/30, images personally reviewed, showed multiple acute infarcts in multiple vascular territories. He received inpatient rehab, then [facility] ST. He has not received ST for about 2 months per s/o.   PAIN:  Are you having pain? No  FALLS: Has patient fallen in last 6 months?  No  LIVING ENVIRONMENT: Lives with: lives with their family and lives with their spouse Lives in: House/apartment  PLOF:  Level of assistance: Independent with ADLs, Independent with IADLs Employment: Retired  PATIENT GOALS: "I want to speak accurately and fluently"  OBJECTIVE:  Note: Objective measures were completed at Evaluation unless otherwise noted.    COGNITION: Overall cognitive status: Within functional limits for tasks  assessed Areas of impairment:  none Functional deficits:   AUDITORY COMPREHENSION: Overall auditory comprehension: Impaired: complex YES/NO questions: Impaired: complex Following directions: Appears intact Conversation: Simple Interfering components: processing speed Effective technique: pausing and repetition/stressing words  READING COMPREHENSION: Intact  EXPRESSION: verbal  VERBAL EXPRESSION: Level of generative/spontaneous verbalization: phrase Automatic speech: counting:  intact, day of week: intact, and month of year: intact  Repetition: Appears intact Naming: Confrontation: 51-75% and Divergent: 26-50% Pragmatics: Appears intact Comments:  Interfering components:  n/a Effective technique: semantic cues and phonemic cues Non-verbal means of communication: N/A  WRITTEN EXPRESSION: Dominant hand: left Written expression: Impaired: phrase  MOTOR SPEECH: Overall motor speech: Appears intact  ORAL MOTOR EXAMINATION: Overall status: WFL Comments:   STANDARDIZED ASSESSMENTS: QAB: Moderate 7.36  PATIENT REPORTED OUTCOME MEASURES (PROM): Communication Participation Item Bank: 9/30 - His s/o rated all items a 1 or quite a bit of difficulty except she rated a 0 or very much difficulty communicating when he needs to say something quickly   TODAY'S TREATMENT:                                                                                                                                         DATE:   03/27/23 (eval day): reviewed goals, provided homework   PATIENT EDUCATION: Education details: See Today's Treatment, See patient instructions; aphasia compensations and education Person educated: Patient and Spouse Education method: Explanation, Demonstration, and Verbal cues Education comprehension: needs further education   GOALS: Goals reviewed with patient? Yes  SHORT TERM GOALS: Target date: 05/01/23  Pt will  name 10 items in personally relevant category with occasional min A Baseline: Goal status: INITIAL  2.  Pt will employ verbal compensations for word finding in structured naming task with occasional min A Baseline:  Goal status: INITIAL  3.  Pt will generate moderately complex sentences in structured tasks such as Film/video editor with occasional min A Baseline:  Goal status: INITIAL  4.  Pt will use compensations for aphasia to place order at unfamiliar restaurant with occasional min A Baseline:  Goal status:  INITIAL  5.  Pt will type/write simple 3-5 word sentence or response to train for texting  with occasional min A Baseline:  Goal status: INITIAL   LONG TERM GOALS: Target date: 06/19/23  Pt will complete complex naming task with 90% accuracy and rare min A Baseline:  Goal status: INITIAL  2.  Pt will use compensations for aphasia in conversation as needed 3/5 opportunities with occasional min A Baseline:  Goal status: INITIAL  3.  Pt will use compensations for aphasia to place a simple business call and to order at 3 restaurants with rare min A from spouse. Baseline:  Goal status: INITIAL  4.  Pt will respond to 3 texts with simple 3-5 word message or emoji Baseline:  Goal status: INITIAL  5.  Pt will  improve score on Communication Participation Item Bank by 3 points Baseline: 9 Goal status: INITIAL   ASSESSMENT:  CLINICAL IMPRESSION: Patient is a 57 y.o. male who was seen today for moderate non fluent aphasia. He reports significant frustration and was tearful throughout evaluation. He named 5 animals in 1 minute and named 2/4 restaurants they frequent with cues from [place]. He is not able to respond to texts. Written expression intact to biographical information. Aphasic errors in family names and simple phrases. Visual impairments result in writing slanted down  the page. He was unaware of aphasic errors in written expression (ex: Hhte/the; rrease/[place]). He is going out to eat, having Toni Amend order for him. Frequent restaurants know him and his order. Prior to CVA, Shivin enjoyed working on classic cars, [place], attended car shows. He has been getting together with some friends. I recommend skilled ST to maximize communication for safety, independence and QOL.  OBJECTIVE IMPAIRMENTS: include aphasia. These impairments are limiting patient from return to work, Clinical cytogeneticist finances, household responsibilities, ADLs/IADLs, and effectively communicating at home and in  community. Factors affecting potential to achieve goals and functional outcome are severity of impairments. Patient will benefit from skilled SLP services to address above impairments and improve overall function.  REHAB POTENTIAL: Good  PLAN:  SLP FREQUENCY: 2x/week  SLP DURATION: 12 weeks  PLANNED INTERVENTIONS: 92507 Treatment of speech (30 or 45 min) , Language facilitation, and Environmental controls    Nayah Lukens, Radene Journey, CCC-SLP 03/27/2023, 4:14 PM

## 2023-04-01 ENCOUNTER — Ambulatory Visit: Payer: No Typology Code available for payment source | Admitting: Speech Pathology

## 2023-04-01 ENCOUNTER — Encounter: Payer: Self-pay | Admitting: Speech Pathology

## 2023-04-01 DIAGNOSIS — R4701 Aphasia: Secondary | ICD-10-CM | POA: Diagnosis not present

## 2023-04-01 NOTE — Therapy (Signed)
OUTPATIENT SPEECH LANGUAGE PATHOLOGY APHASIA TREATMENT   Patient Name: Gerald Robles MRN: [id number] DOB:06/28/1965, 57 y.o., male Today's Date: 04/01/2023  PCP: Marden Noble MD REFERRING PROVIDER: Marlowe Shores, MD  END OF SESSION:  End of Session - 04/01/23 1231     Visit Number 2    Number of Visits 25    Date for SLP Re-Evaluation 06/19/23    Authorization Type [facility] - 15 visits    Authorization Time Period 03/01/23-08/28/23    Authorization - Visit Number 2    Authorization - Number of Visits 15    SLP Start Time 1230    SLP Stop Time  1315    SLP Time Calculation (min) 45 min    Activity Tolerance Patient limited by pain;Patient tolerated treatment well             Past Medical History:  Diagnosis Date   A-fib (HCC)    Anemia    Anemia    Arthritis    ED (erectile dysfunction)    Hypertension    Sarcoidosis 2003   Past Surgical History:  Procedure Laterality Date   CARDIOVERSION N/A 11/08/2020   Procedure: CARDIOVERSION;  Surgeon: Sande Rives, MD;  Location: [facility] ENDOSCOPY;  Service: Cardiovascular;  Laterality: N/A;   KNEE ARTHROPLASTY Left 08/09/2016   Procedure: LEFT TOTAL KNEE ARTHROPLASTY WITH COMPUTER NAVIGATION;  Surgeon: Samson Frederic, MD;  Location: [facility] ORS;  Service: Orthopedics;  Laterality: Left;  Needs RNFA   KNEE ARTHROPLASTY Right 12/13/2016   Procedure: RIGHT TOTAL KNEE ARTHROPLASTY WITH COMPUTER NAVIGATION;  Surgeon: Samson Frederic, MD;  Location: [facility] ORS;  Service: Orthopedics;  Laterality: Right;  Needs RNFA   KNEE SURGERY Bilateral 1990   right acl with arthsroscopy; left kee artshroscopy    Right total knee     12/13/16 Dr. Linna Caprice   SHOULDER SURGERY Right    twice   VASECTOMY     Patient Active Problem List   Diagnosis Date Noted   Hyponatremia 11/06/2020   Hypertension    Nonischemic cardiomyopathy (HCC)    Post-traumatic osteoarthritis of right knee 12/13/2016   Osteoarthritis of left knee 08/09/2016   Paroxysmal  atrial fibrillation (HCC) 05/04/2015    ONSET DATE: 03/18/2023 (referral date)  REFERRING DIAG:  Diagnosis  I63.9 (ICD-10-CM) - Cerebral infarction, unspecified    THERAPY DIAG:  Aphasia  Rationale for Evaluation and Treatment: Rehabilitation  SUBJECTIVE:   SUBJECTIVE STATEMENT: "I'm ok" Pt accompanied by: significant other  PERTINENT HISTORY: The patient was admitted to [facility] 3/27-4/16/2024, initially presenting with dyspnea and hypoxia due to pneumonia, with hospitalization complicated by 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 His home rivaroxaban had been held the day prior due to a planned bronchoscopy. Follow-up MRI brain from 3/30, images personally reviewed, showed multiple acute infarcts in multiple vascular territories. He received inpatient rehab, then [facility] ST. He has not received ST for about 2 months per s/o.   PAIN:  Are you having pain? No   TODAY'S TREATMENT:  DATE:   04/01/23: To target word finding, sentence generation,   Scientist, product/process development (VNeST) was utilized. The pt generated 3 subjects and objects for 4 verbs, (drive, throw, measure, deliver) for a total of 12 subject objects. Pt required occasional min to  mod semantic and questioning  cues. Pt generated 4 complex sentences by answering "wh" questions. Pt required occasional min semantic cues to generate complex sentences. Targeted divergent naming of car parts - Gianncarlo named 10 car parts with usual mod semantic cues.    03/27/23 (eval day): reviewed goals, provided homework   PATIENT EDUCATION: Education details: See Today's Treatment, See patient instructions; aphasia compensations and education Person educated: Patient and Spouse Education method: Explanation, Demonstration, and Verbal  cues Education comprehension: needs further education   GOALS: Goals reviewed with patient? Yes  SHORT TERM GOALS: Target date: 05/01/23  Pt will  name 10 items in personally relevant category with occasional min A Baseline: Goal status: INITIAL  2.  Pt will employ verbal compensations for word finding in structured naming task with occasional min A Baseline:  Goal status: INITIAL  3.  Pt will generate moderately complex sentences in structured tasks such as Film/video editor with occasional min A Baseline:  Goal status: INITIAL  4.  Pt will use compensations for aphasia to place order at unfamiliar restaurant with occasional min A Baseline:  Goal status: INITIAL  5.  Pt will type/write simple 3-5 word sentence or response to train for texting  with occasional min A Baseline:  Goal status: INITIAL   LONG TERM GOALS: Target date: 06/19/23  Pt will complete complex naming task with 90% accuracy and rare min A Baseline:  Goal status: INITIAL  2.  Pt will use compensations for aphasia in conversation as needed 3/5 opportunities with occasional min A Baseline:  Goal status: INITIAL  3.  Pt will use compensations for aphasia to place a simple business call and to order at 3 restaurants with rare min A from spouse. Baseline:  Goal status: INITIAL  4.  Pt will respond to 3 texts with simple 3-5 word message or emoji Baseline:  Goal status: INITIAL  5.  Pt will improve score on Communication Participation Item Bank by 3 points Baseline: 9 Goal status: INITIAL   ASSESSMENT:  CLINICAL IMPRESSION: Patient is a 57 y.o. male who was seen today for moderate non fluent aphasia. He reports significant frustration and was tearful throughout evaluation. He named 5 animals in 1 minute and named 2/4 restaurants they frequent with cues from [place]. He is not able to respond to texts. Written expression intact to biographical information. Aphasic errors in family names  and simple phrases. Visual impairments result in writing slanted down  the page. He was unaware of aphasic errors in written expression (ex: Hhte/the; rrease/[place]). He is going out to eat, having Toni Amend order for him. Frequent restaurants know him and his order. Prior to CVA, Thelonius enjoyed working on classic cars, [place], attended car shows. He has been getting together with some friends. I recommend skilled ST to maximize communication for safety, independence and QOL.  OBJECTIVE IMPAIRMENTS: include aphasia. These impairments are limiting patient from return to work, Clinical cytogeneticist finances, household responsibilities, ADLs/IADLs, and effectively communicating at home and in community. Factors affecting potential to achieve goals and functional outcome are severity of impairments. Patient will benefit from skilled SLP services to address above impairments and improve overall function.  REHAB POTENTIAL: Good  PLAN:  SLP FREQUENCY: 2x/week  SLP DURATION: 12 weeks  PLANNED INTERVENTIONS: 92507 Treatment of speech (30 or 45 min) , Language facilitation, and Environmental controls    Atif Chapple, Radene Journey, CCC-SLP 04/01/2023, 2:58 PM

## 2023-04-08 ENCOUNTER — Ambulatory Visit: Payer: No Typology Code available for payment source

## 2023-04-10 ENCOUNTER — Ambulatory Visit: Payer: No Typology Code available for payment source | Attending: Internal Medicine

## 2023-04-10 DIAGNOSIS — R4701 Aphasia: Secondary | ICD-10-CM | POA: Insufficient documentation

## 2023-04-10 NOTE — Therapy (Signed)
OUTPATIENT SPEECH LANGUAGE PATHOLOGY APHASIA TREATMENT   Patient Name: Gerald Robles MRN: [id number] DOB:Sep 06, 1965, 57 y.o., male Today's Date: 04/10/2023  PCP: Gerald Noble MD REFERRING PROVIDER: Marlowe Shores, MD  END OF SESSION:  End of Session - 04/10/23 0802     Visit Number 3    Number of Visits 25    Date for SLP Re-Evaluation 06/19/23    Authorization Type [facility] - 15 visits    Authorization Time Period 03/01/23-08/28/23    Authorization - Number of Visits 15    SLP Start Time 0802    SLP Stop Time  0845    SLP Time Calculation (min) 43 min    Activity Tolerance Patient tolerated treatment well             Past Medical History:  Diagnosis Date   A-fib (HCC)    Anemia    Anemia    Arthritis    ED (erectile dysfunction)    Hypertension    Sarcoidosis 2003   Past Surgical History:  Procedure Laterality Date   CARDIOVERSION N/A 11/08/2020   Procedure: CARDIOVERSION;  Surgeon: Sande Rives, MD;  Location: [facility] ENDOSCOPY;  Service: Cardiovascular;  Laterality: N/A;   KNEE ARTHROPLASTY Left 08/09/2016   Procedure: LEFT TOTAL KNEE ARTHROPLASTY WITH COMPUTER NAVIGATION;  Surgeon: Samson Frederic, MD;  Location: [facility] ORS;  Service: Orthopedics;  Laterality: Left;  Needs RNFA   KNEE ARTHROPLASTY Right 12/13/2016   Procedure: RIGHT TOTAL KNEE ARTHROPLASTY WITH COMPUTER NAVIGATION;  Surgeon: Samson Frederic, MD;  Location: [facility] ORS;  Service: Orthopedics;  Laterality: Right;  Needs RNFA   KNEE SURGERY Bilateral 1990   right acl with arthsroscopy; left kee artshroscopy    Right total knee     12/13/16 Dr. Linna Caprice   SHOULDER SURGERY Right    twice   VASECTOMY     Patient Active Problem List   Diagnosis Date Noted   Hyponatremia 11/06/2020   Hypertension    Nonischemic cardiomyopathy (HCC)    Post-traumatic osteoarthritis of right knee 12/13/2016   Osteoarthritis of left knee 08/09/2016   Paroxysmal atrial fibrillation (HCC) 05/04/2015    ONSET DATE:  03/18/2023 (referral date)  REFERRING DIAG:  Diagnosis  I63.9 (ICD-10-CM) - Cerebral infarction, unspecified   THERAPY DIAG: Aphasia  Rationale for Evaluation and Treatment: Rehabilitation  SUBJECTIVE:   SUBJECTIVE STATEMENT: Pt reports that conversation over Thanksgiving went well Pt accompanied by: significant other  PERTINENT HISTORY: The patient was admitted to [facility] 3/27-4/16/2024, initially presenting with dyspnea and hypoxia due to pneumonia, with hospitalization complicated by 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 His home rivaroxaban had been held the day prior due to a planned bronchoscopy. Follow-up MRI brain from 3/30, images personally reviewed, showed multiple acute infarcts in multiple vascular territories. He received inpatient rehab, then [facility] ST. He has not received ST for about 2 months per s/o.   PAIN:  Are you having pain? No   TODAY'S TREATMENT:  04/10/23: During informal conversation, pt uses frequent vague speech and short responses (1-2 words) to answer SLP guiding questions. Addressed word finding and sentence generation with Scientist, product/process development (VNeST). For the verbs "eat" and "like", the patient generated 3 subject and objects for each verb, a total of 6 subjects and objects with usual mod-max questioning cues required. Pt generated 3 complex sentences for each verb by answering "wh" questions with occasional mod cues. Targeted response elaboration in picture description task, where pt generated complex sentences given occasional mod A. Pt exhibited occasional semantic paraphasias and perseveration, but was generally aware of errors and corrected with min-mod cues.   04/01/23: To target word finding, sentence generation,   Fish farm manager (VNeST) was utilized. The pt generated 3 subjects and objects for 4 verbs, (drive, throw, measure, deliver) for a total of 12 subject objects. Pt required occasional min to  mod semantic and questioning  cues. Pt generated 4 complex sentences by answering "wh" questions. Pt required occasional min semantic cues to generate complex sentences. Targeted divergent naming of car parts - [name] named 10 car parts with usual mod semantic cues.    03/27/23 (eval day): reviewed goals, provided homework   PATIENT EDUCATION: Education details: See Today's Treatment, See patient instructions; aphasia compensations and education Person educated: Patient and Spouse Education method: Explanation, Demonstration, and Verbal cues Education comprehension: needs further education   GOALS: Goals reviewed with patient? Yes  SHORT TERM GOALS: Target date: 05/01/23  Pt will  name 10 items in personally relevant category with occasional min A Baseline: Goal status: IN PROGRESS  2.  Pt will employ verbal compensations for word finding in structured naming task with occasional min A Baseline:  Goal status: IN PROGRESS  3.  Pt will generate moderately complex sentences in structured tasks such as Film/video editor with occasional min A Baseline:  Goal status: IN PROGRESS  4.  Pt will use compensations for aphasia to place order at unfamiliar restaurant with occasional min A Baseline:  Goal status: IN PROGRESS  5.  Pt will type/write simple 3-5 word sentence or response to train for texting  with occasional min A Baseline:  Goal status: IN PROGRESS   LONG TERM GOALS: Target date: 06/19/23  Pt will complete complex naming task with 90% accuracy and rare min A Baseline:  Goal status: IN PROGRESS  2.  Pt will use compensations for aphasia in conversation as needed 3/5 opportunities with occasional min A Baseline:  Goal status: IN PROGRESS  3.  Pt will use compensations for  aphasia to place a simple business call and to order at 3 restaurants with rare min A from spouse. Baseline:  Goal status: IN PROGRESS  4.  Pt will respond to 3 texts with simple 3-5 word message or emoji Baseline:  Goal status: IN PROGRESS  5.  Pt will improve score on Communication Participation Item Bank by 3 points Baseline: 9 Goal status: IN PROGRESS   ASSESSMENT:  CLINICAL IMPRESSION: Patient is a 57 y.o. 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 He reports significant frustration with his communication. Addressed sentence generation and response elaboration with VNeST and picture description task. Prior to CVA, [place] enjoyed working on classic cars, [place], attended car shows. He has been getting together with some friends. Pt will continue to benefit from skilled ST to maximize communication for safety, independence and QOL.  OBJECTIVE IMPAIRMENTS: include  aphasia. These impairments are limiting patient from return to work, Clinical cytogeneticist finances, household responsibilities, ADLs/IADLs, and effectively communicating at home and in community. Factors affecting potential to achieve goals and functional outcome are severity of impairments. Patient will benefit from skilled SLP services to address above impairments and improve overall function.  REHAB POTENTIAL: Good  PLAN:  SLP FREQUENCY: 2x/week  SLP DURATION: 12 weeks  PLANNED INTERVENTIONS: 92507 Treatment of speech (30 or 45 min) , Language facilitation, and Environmental controls    Gracy Racer, CCC-SLP 04/10/2023, 9:25 AM

## 2023-04-15 ENCOUNTER — Ambulatory Visit: Payer: No Typology Code available for payment source | Admitting: Speech Pathology

## 2023-04-15 ENCOUNTER — Encounter: Payer: Self-pay | Admitting: Speech Pathology

## 2023-04-15 DIAGNOSIS — R4701 Aphasia: Secondary | ICD-10-CM

## 2023-04-15 NOTE — Therapy (Signed)
OUTPATIENT SPEECH LANGUAGE PATHOLOGY APHASIA TREATMENT   Patient Name: Gerald Robles MRN: [id number] DOB:09-Jan-1966, 57 y.o., male Today's Date: 04/15/2023  PCP: Marden Noble MD REFERRING PROVIDER: Marlowe Shores, MD  END OF SESSION:  End of Session - 04/15/23 1021     Visit Number 4    Number of Visits 25    Date for SLP Re-Evaluation 06/19/23    Authorization Type [facility] - 15 visits    Authorization Time Period 03/01/23-08/28/23    Authorization - Visit Number 3    Authorization - Number of Visits 15    SLP Start Time 1015    SLP Stop Time  1100    SLP Time Calculation (min) 45 min    Activity Tolerance Patient tolerated treatment well             Past Medical History:  Diagnosis Date   A-fib (HCC)    Anemia    Anemia    Arthritis    ED (erectile dysfunction)    Hypertension    Sarcoidosis 2003   Past Surgical History:  Procedure Laterality Date   CARDIOVERSION N/A 11/08/2020   Procedure: CARDIOVERSION;  Surgeon: Sande Rives, MD;  Location: [facility] ENDOSCOPY;  Service: Cardiovascular;  Laterality: N/A;   KNEE ARTHROPLASTY Left 08/09/2016   Procedure: LEFT TOTAL KNEE ARTHROPLASTY WITH COMPUTER NAVIGATION;  Surgeon: Samson Frederic, MD;  Location: [facility] ORS;  Service: Orthopedics;  Laterality: Left;  Needs RNFA   KNEE ARTHROPLASTY Right 12/13/2016   Procedure: RIGHT TOTAL KNEE ARTHROPLASTY WITH COMPUTER NAVIGATION;  Surgeon: Samson Frederic, MD;  Location: [facility] ORS;  Service: Orthopedics;  Laterality: Right;  Needs RNFA   KNEE SURGERY Bilateral 1990   right acl with arthsroscopy; left kee artshroscopy    Right total knee     12/13/16 Dr. Linna Caprice   SHOULDER SURGERY Right    twice   VASECTOMY     Patient Active Problem List   Diagnosis Date Noted   Hyponatremia 11/06/2020   Hypertension    Nonischemic cardiomyopathy (HCC)    Post-traumatic osteoarthritis of right knee 12/13/2016   Osteoarthritis of left knee 08/09/2016   Paroxysmal atrial fibrillation (HCC)  05/04/2015    ONSET DATE: 03/18/2023 (referral date)  REFERRING DIAG:  Diagnosis  I63.9 (ICD-10-CM) - Cerebral infarction, unspecified   THERAPY DIAG: Aphasia  Rationale for Evaluation and Treatment: Rehabilitation  SUBJECTIVE:   SUBJECTIVE STATEMENT: Pt reports that conversation over Thanksgiving went well Pt accompanied by: significant other  PERTINENT HISTORY: The patient was admitted to [facility] 3/27-4/16/2024, initially presenting with dyspnea and hypoxia due to pneumonia, with hospitalization complicated by 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 His home rivaroxaban had been held the day prior due to a planned bronchoscopy. Follow-up MRI brain from 3/30, images personally reviewed, showed multiple acute infarcts in multiple vascular territories. He received inpatient rehab, then [facility] ST. He has not received ST for about 2 months per s/o.   PAIN:  Are you having pain? No   TODAY'S TREATMENT:  04/15/23: Targeted divergent naming of personally relevant category with occasional min A he named and wrote 15 car parts. Improved from 1st session. Targeted verbal compensations for aphasia using semantic feature analysis (SFA) - due to difficulty with words, modified task to use of photo cards with known category (animals) Gerald Robles required frequent mod questioning cues, visual cues (SFA chart) and written cues to generate 3 salient descriptions for 7 animals - extended time consistently  04/10/23: During informal conversation, pt uses frequent vague speech and short responses (1-2 words) to answer SLP guiding questions. Addressed word finding and sentence generation with Scientist, product/process development (VNeST). For the verbs "eat" and "like", the patient generated 3 subject and objects for each  verb, a total of 6 subjects and objects with usual mod-max questioning cues required. Pt generated 3 complex sentences for each verb by answering "wh" questions with occasional mod cues. Targeted response elaboration in picture description task, where pt generated complex sentences given occasional mod A. Pt exhibited occasional semantic paraphasias and perseveration, but was generally aware of errors and corrected with min-mod cues.   04/01/23: To target word finding, sentence generation,   Scientist, product/process development (VNeST) was utilized. The pt generated 3 subjects and objects for 4 verbs, (drive, throw, measure, deliver) for a total of 12 subject objects. Pt required occasional min to  mod semantic and questioning  cues. Pt generated 4 complex sentences by answering "wh" questions. Pt required occasional min semantic cues to generate complex sentences. Targeted divergent naming of car parts - Gerald Robles named 10 car parts with usual mod semantic cues.    03/27/23 (eval day): reviewed goals, provided homework   PATIENT EDUCATION: Education details: See Today's Treatment, See patient instructions; aphasia compensations and education Person educated: Patient and Spouse Education method: Explanation, Demonstration, and Verbal cues Education comprehension: needs further education   GOALS: Goals reviewed with patient? Yes  SHORT TERM GOALS: Target date: 05/01/23  Pt will  name 10 items in personally relevant category with occasional min A Baseline: Goal status: IN PROGRESS  2.  Pt will employ verbal compensations for word finding in structured naming task with occasional min A Baseline:  Goal status: IN PROGRESS  3.  Pt will generate moderately complex sentences in structured tasks such as Film/video editor with occasional min A Baseline:  Goal status: IN PROGRESS  4.  Pt will use compensations for aphasia to place order at unfamiliar restaurant with occasional min  A Baseline:  Goal status: IN PROGRESS  5.  Pt will type/write simple 3-5 word sentence or response to train for texting  with occasional min A Baseline:  Goal status: IN PROGRESS   LONG TERM GOALS: Target date: 06/19/23  Pt will complete complex naming task with 90% accuracy and rare min A Baseline:  Goal status: IN PROGRESS  2.  Pt will use compensations for aphasia in conversation as needed 3/5 opportunities with occasional min A Baseline:  Goal status: IN PROGRESS  3.  Pt will use compensations for aphasia to place a simple business call and to order at 3 restaurants with rare min A from spouse. Baseline:  Goal status: IN PROGRESS  4.  Pt will respond to 3 texts with simple 3-5 word message or emoji Baseline:  Goal status: IN PROGRESS  5.  Pt will improve score on Communication Participation Item Bank by 3 points Baseline: 9 Goal status: IN PROGRESS   ASSESSMENT:  CLINICAL IMPRESSION: Patient is a 57 y.o. 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 He reports significant frustration with his communication. Addressed sentence generation and response elaboration with VNeST and picture description task. Prior to CVA, Moshe enjoyed working on classic cars, [place], attended car shows. He has been getting together with some friends. Pt will continue to benefit from skilled ST to maximize communication for safety, independence and QOL.  OBJECTIVE IMPAIRMENTS: include aphasia. These impairments are limiting patient from return to work, Clinical cytogeneticist finances, household responsibilities, ADLs/IADLs, and effectively communicating at home and in community. Factors affecting potential to achieve goals and functional outcome are severity of impairments. Patient will benefit from skilled SLP services to address above impairments and improve overall function.  REHAB POTENTIAL: Good  PLAN:  SLP FREQUENCY:  2x/week  SLP DURATION: 12 weeks  PLANNED INTERVENTIONS: 92507 Treatment of speech (30 or 45 min) , Language facilitation, and Environmental controls    Rogenia Werntz, Radene Journey, CCC-SLP 04/15/2023, 11:00 AM

## 2023-04-17 ENCOUNTER — Ambulatory Visit: Payer: No Typology Code available for payment source

## 2023-04-17 DIAGNOSIS — R4701 Aphasia: Secondary | ICD-10-CM

## 2023-04-17 NOTE — Therapy (Signed)
OUTPATIENT SPEECH LANGUAGE PATHOLOGY APHASIA TREATMENT   Patient Name: Gerald Robles MRN: [id number] DOB:1965-09-17, 57 y.o., male Today's Date: 04/17/2023  PCP: Marden Noble MD REFERRING PROVIDER: Marlowe Shores, MD  END OF SESSION:  End of Session - 04/17/23 1235     Visit Number 5    Number of Visits 25    Date for SLP Re-Evaluation 06/19/23    Authorization Type [facility] - 15 visits    Authorization Time Period 03/01/23-08/28/23    Authorization - Visit Number 4    Authorization - Number of Visits 15    SLP Start Time 1235    SLP Stop Time  1315    SLP Time Calculation (min) 40 min    Activity Tolerance Patient tolerated treatment well              Past Medical History:  Diagnosis Date   A-fib (HCC)    Anemia    Anemia    Arthritis    ED (erectile dysfunction)    Hypertension    Sarcoidosis 2003   Past Surgical History:  Procedure Laterality Date   CARDIOVERSION N/A 11/08/2020   Procedure: CARDIOVERSION;  Surgeon: Sande Rives, MD;  Location: [facility] ENDOSCOPY;  Service: Cardiovascular;  Laterality: N/A;   KNEE ARTHROPLASTY Left 08/09/2016   Procedure: LEFT TOTAL KNEE ARTHROPLASTY WITH COMPUTER NAVIGATION;  Surgeon: Samson Frederic, MD;  Location: [facility];  Service: Orthopedics;  Laterality: Left;  Needs RNFA   KNEE ARTHROPLASTY Right 12/13/2016   Procedure: RIGHT TOTAL KNEE ARTHROPLASTY WITH COMPUTER NAVIGATION;  Surgeon: Samson Frederic, MD;  Location: [facility];  Service: Orthopedics;  Laterality: Right;  Needs RNFA   KNEE SURGERY Bilateral 1990   right acl with arthsroscopy; left kee artshroscopy    Right total knee     12/13/16 Dr. Linna Caprice   SHOULDER SURGERY Right    twice   VASECTOMY     Patient Active Problem List   Diagnosis Date Noted   Hyponatremia 11/06/2020   Hypertension    Nonischemic cardiomyopathy (HCC)    Post-traumatic osteoarthritis of right knee 12/13/2016   Osteoarthritis of left knee 08/09/2016   Paroxysmal atrial fibrillation  (HCC) 05/04/2015    ONSET DATE: 03/18/2023 (referral date)  REFERRING DIAG:  Diagnosis  I63.9 (ICD-10-CM) - Cerebral infarction, unspecified   THERAPY DIAG: Aphasia  Rationale for Evaluation and Treatment: Rehabilitation  SUBJECTIVE:   SUBJECTIVE STATEMENT: "I think it's getting a little better (speech)" Pt accompanied by: significant other  PERTINENT HISTORY: The patient was admitted to [facility] 3/27-4/16/2024, initially presenting with dyspnea and hypoxia due to pneumonia, with hospitalization complicated by 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 His home rivaroxaban had been held the day prior due to a planned bronchoscopy. Follow-up MRI brain from 3/30, images personally reviewed, showed multiple acute infarcts in multiple vascular territories. He received inpatient rehab, then [facility] ST. He has not received ST for about 2 months per s/o.   PAIN:  Are you having pain? No   TODAY'S TREATMENT:  04/17/23: Endorsed some improvements in speech since initiating ST. Dicussed communication challenges at home, including intermittent frustration with anomia impeding discourse. Recommended using 5 intentional breaths to reduce frustration and caregivers using cueing hierarchy to aid anomia. Pt able to demonstrate intentional breathing to reduce frustration during structured tasks with mod I. Wife able to provide appropriate cues with rare prompting. Targeted response generation to close-ended conversational questions. Occasional repetitions required to aid auditory comprehension. Trialed pt repeating question prior to answering without successful carryover. Provided overt non-verbal and written cues to aid awareness of dysnomia, which was successful. Pt able to correct dysnomia with usual fading to rare min A  today. Intermittently able to describe targeted words with guiding questions. Provided open-ended questions as part of HEP.   04/15/23: Targeted divergent naming of personally relevant category with occasional min A he named and wrote 15 car parts. Improved from 1st session. Targeted verbal compensations for aphasia using semantic feature analysis (SFA) - due to difficulty with words, modified task to use of photo cards with known category (animals) Gerald Robles required frequent mod questioning cues, visual cues (SFA chart) and written cues to generate 3 salient descriptions for 7 animals - extended time consistently  04/10/23: During informal conversation, pt uses frequent vague speech and short responses (1-2 words) to answer SLP guiding questions. Addressed word finding and sentence generation with Scientist, product/process development (VNeST). For the verbs "eat" and "like", the patient generated 3 subject and objects for each verb, a total of 6 subjects and objects with usual mod-max questioning cues required. Pt generated 3 complex sentences for each verb by answering "wh" questions with occasional mod cues. Targeted response elaboration in picture description task, where pt generated complex sentences given occasional mod A. Pt exhibited occasional semantic paraphasias and perseveration, but was generally aware of errors and corrected with min-mod cues.   04/01/23: To target word finding, sentence generation,   Scientist, product/process development (VNeST) was utilized. The pt generated 3 subjects and objects for 4 verbs, (drive, throw, measure, deliver) for a total of 12 subject objects. Pt required occasional min to  mod semantic and questioning  cues. Pt generated 4 complex sentences by answering "wh" questions. Pt required occasional min semantic cues to generate complex sentences. Targeted divergent naming of car parts - Gerald Robles named 10 car parts with usual mod semantic cues.    03/27/23 (eval day):  reviewed goals, provided homework   PATIENT EDUCATION: Education details: See Today's Treatment, See patient instructions; aphasia compensations and education Person educated: Patient and Spouse Education method: Explanation, Demonstration, and Verbal cues Education comprehension: needs further education   GOALS: Goals reviewed with patient? Yes  SHORT TERM GOALS: Target date: 05/01/23  Pt will  name 10 items in personally relevant category with occasional min A Baseline: Goal status: IN PROGRESS  2.  Pt will employ verbal compensations for word finding in structured naming task with occasional min A Baseline:  Goal status: IN PROGRESS  3.  Pt will generate moderately complex sentences in structured tasks such as Film/video editor with occasional min A Baseline:  Goal status: IN PROGRESS  4.  Pt will use compensations for aphasia to place order at unfamiliar restaurant with occasional min A Baseline:  Goal status: IN PROGRESS  5.  Pt will type/write simple 3-5 word sentence or response to train for texting  with occasional min A Baseline:  Goal status: IN PROGRESS   LONG TERM GOALS: Target date: 06/19/23  Pt will  complete complex naming task with 90% accuracy and rare min A Baseline:  Goal status: IN PROGRESS  2.  Pt will use compensations for aphasia in conversation as needed 3/5 opportunities with occasional min A Baseline:  Goal status: IN PROGRESS  3.  Pt will use compensations for aphasia to place a simple business call and to order at 3 restaurants with rare min A from spouse. Baseline:  Goal status: IN PROGRESS  4.  Pt will respond to 3 texts with simple 3-5 word message or emoji Baseline:  Goal status: IN PROGRESS  5.  Pt will improve score on Communication Participation Item Bank by 3 points Baseline: 9 Goal status: IN PROGRESS   ASSESSMENT:  CLINICAL IMPRESSION: Patient is a 57 y.o. male who was seen today for moderate non fluent  aphasia c/b semantic paraphasias, perseveration, and short, vague responses. He reports significant frustration with his communication. Addressed sentence generation and response elaboration for close-ended questions. Pt able to demonstrate targeted techniques with occasional min A. Prior to CVA, [place] enjoyed working on classic cars, [place], attended car shows. He has been getting together with some friends. Pt will continue to benefit from skilled ST to maximize communication for safety, independence and QOL.  OBJECTIVE IMPAIRMENTS: include aphasia. These impairments are limiting patient from return to work, Clinical cytogeneticist finances, household responsibilities, ADLs/IADLs, and effectively communicating at home and in community. Factors affecting potential to achieve goals and functional outcome are severity of impairments. Patient will benefit from skilled SLP services to address above impairments and improve overall function.  REHAB POTENTIAL: Good  PLAN:  SLP FREQUENCY: 2x/week  SLP DURATION: 12 weeks  PLANNED INTERVENTIONS: 92507 Treatment of speech (30 or 45 min) , Language facilitation, and Environmental controls    Gracy Racer, CCC-SLP 04/17/2023, 1:20 PM

## 2023-04-22 ENCOUNTER — Encounter: Payer: Self-pay | Admitting: Speech Pathology

## 2023-04-22 ENCOUNTER — Ambulatory Visit: Payer: No Typology Code available for payment source | Admitting: Speech Pathology

## 2023-04-22 DIAGNOSIS — R4701 Aphasia: Secondary | ICD-10-CM

## 2023-04-22 NOTE — Therapy (Signed)
OUTPATIENT SPEECH LANGUAGE PATHOLOGY APHASIA TREATMENT   Patient Name: Gerald Robles MRN: [id number] DOB:[birth year], 57 y.o., male Today's Date: 04/22/2023  PCP: Gerald Noble MD REFERRING PROVIDER: Marlowe Shores, MD  END OF SESSION:  End of Session - 04/22/23 1111     Visit Number 6    Number of Visits 25    Date for SLP Re-Evaluation 06/19/23    Authorization Type [facility] - 15 visits    Authorization Time Period 03/01/23-08/28/23    Authorization - Visit Number 5    Authorization - Number of Visits 15    SLP Start Time 1110   arrived late   SLP Stop Time  1145    SLP Time Calculation (min) 35 min    Activity Tolerance Patient tolerated treatment well              Past Medical History:  Diagnosis Date   A-fib (HCC)    Anemia    Anemia    Arthritis    ED (erectile dysfunction)    Hypertension    Sarcoidosis 2003   Past Surgical History:  Procedure Laterality Date   CARDIOVERSION N/A 11/08/2020   Procedure: CARDIOVERSION;  Surgeon: Sande Rives, MD;  Location: [facility] ENDOSCOPY;  Service: Cardiovascular;  Laterality: N/A;   KNEE ARTHROPLASTY Left 08/09/2016   Procedure: LEFT TOTAL KNEE ARTHROPLASTY WITH COMPUTER NAVIGATION;  Surgeon: Samson Frederic, MD;  Location: [facility] ORS;  Service: Orthopedics;  Laterality: Left;  Needs RNFA   KNEE ARTHROPLASTY Right 12/13/2016   Procedure: RIGHT TOTAL KNEE ARTHROPLASTY WITH COMPUTER NAVIGATION;  Surgeon: Samson Frederic, MD;  Location: [facility] ORS;  Service: Orthopedics;  Laterality: Right;  Needs RNFA   KNEE SURGERY Bilateral 1990   right acl with arthsroscopy; left kee artshroscopy    Right total knee     12/13/16 Dr. Linna Caprice   SHOULDER SURGERY Right    twice   VASECTOMY     Patient Active Problem List   Diagnosis Date Noted   Hyponatremia 11/06/2020   Hypertension    Nonischemic cardiomyopathy (HCC)    Post-traumatic osteoarthritis of right knee 12/13/2016   Osteoarthritis of left knee 08/09/2016   Paroxysmal atrial  fibrillation (HCC) 05/04/2015    ONSET DATE: 03/18/2023 (referral date)  REFERRING DIAG:  Diagnosis  I63.9 (ICD-10-CM) - Cerebral infarction, unspecified   THERAPY DIAG: Aphasia  Rationale for Evaluation and Treatment: Rehabilitation  SUBJECTIVE:   SUBJECTIVE STATEMENT: "I think it's getting a little better (speech)" Pt accompanied by: significant other  PERTINENT HISTORY: The patient was admitted to [facility] 3/27-4/16/2024, initially presenting with dyspnea and hypoxia due to pneumonia, with hospitalization complicated by 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 His home rivaroxaban had been held the day prior due to a planned bronchoscopy. Follow-up MRI brain from 3/30, images personally reviewed, showed multiple acute infarcts in multiple vascular territories. He received inpatient rehab, then [facility] ST. He has not received ST for about 2 months per s/o.   PAIN:  Are you having pain? No   TODAY'S TREATMENT:  04/22/23: Gerald Robles continues to have limited conversations with family only. Encouraged him to expand communication partners to friends. Targeted sentence generation, thought organization and participation in conversation with conversation starter cards - in this less structured language task, Gerald Robles required frequent questioning cues to clarify vague, empty words. With extended time, and questioning cues he generated 2-3 details for each of the 6 questions. Frustration did not occur today. Reviewed strategies to manage frustration when communication break downs occur.    04/17/23: Endorsed some improvements in speech since initiating ST. Dicussed communication challenges at home, including intermittent frustration with anomia impeding discourse. Recommended using 5 intentional breaths to reduce  frustration and caregivers using cueing hierarchy to aid anomia. Pt able to demonstrate intentional breathing to reduce frustration during structured tasks with mod I. Wife able to provide appropriate cues with rare prompting. Targeted response generation to close-ended conversational questions. Occasional repetitions required to aid auditory comprehension. Trialed pt repeating question prior to answering without successful carryover. Provided overt non-verbal and written cues to aid awareness of dysnomia, which was successful. Pt able to correct dysnomia with usual fading to rare min A today. Intermittently able to describe targeted words with guiding questions. Provided open-ended questions as part of HEP.   04/15/23: Targeted divergent naming of personally relevant category with occasional min A he named and wrote 15 car parts. Improved from 1st session. Targeted verbal compensations for aphasia using semantic feature analysis (SFA) - due to difficulty with words, modified task to use of photo cards with known category (animals) Gerald Robles required frequent mod questioning cues, visual cues (SFA chart) and written cues to generate 3 salient descriptions for 7 animals - extended time consistently  04/10/23: During informal conversation, pt uses frequent vague speech and short responses (1-2 words) to answer SLP guiding questions. Addressed word finding and sentence generation with Scientist, product/process development (VNeST). For the verbs "eat" and "like", the patient generated 3 subject and objects for each verb, a total of 6 subjects and objects with usual mod-max questioning cues required. Pt generated 3 complex sentences for each verb by answering "wh" questions with occasional mod cues. Targeted response elaboration in picture description task, where pt generated complex sentences given occasional mod A. Pt exhibited occasional semantic paraphasias and perseveration, but was generally aware of errors and  corrected with min-mod cues.   04/01/23: To target word finding, sentence generation,   Scientist, product/process development (VNeST) was utilized. The pt generated 3 subjects and objects for 4 verbs, (drive, throw, measure, deliver) for a total of 12 subject objects. Pt required occasional min to  mod semantic and questioning  cues. Pt generated 4 complex sentences by answering "wh" questions. Pt required occasional min semantic cues to generate complex sentences. Targeted divergent naming of car parts - Jacaden named 10 car parts with usual mod semantic cues.    03/27/23 (eval day): reviewed goals, provided homework   PATIENT EDUCATION: Education details: See Today's Treatment, See patient instructions; aphasia compensations and education Person educated: Patient and Spouse Education method: Explanation, Demonstration, and Verbal cues Education comprehension: needs further education   GOALS: Goals reviewed with patient? Yes  SHORT TERM GOALS: Target date: 05/01/23  Pt will  name 10 items in personally relevant category with occasional min A Baseline: Goal status: IN PROGRESS  2.  Pt will employ verbal compensations for word finding in structured naming task with occasional min A Baseline:  Goal status: IN PROGRESS  3.  Pt will generate moderately complex sentences  in structured tasks such as Film/video editor with occasional min A Baseline:  Goal status: IN PROGRESS  4.  Pt will use compensations for aphasia to place order at unfamiliar restaurant with occasional min A Baseline:  Goal status: IN PROGRESS  5.  Pt will type/write simple 3-5 word sentence or response to train for texting  with occasional min A Baseline:  Goal status: IN PROGRESS   LONG TERM GOALS: Target date: 06/19/23  Pt will complete complex naming task with 90% accuracy and rare min A Baseline:  Goal status: IN PROGRESS  2.  Pt will use compensations for aphasia in conversation as needed 3/5  opportunities with occasional min A Baseline:  Goal status: IN PROGRESS  3.  Pt will use compensations for aphasia to place a simple business call and to order at 3 restaurants with rare min A from spouse. Baseline:  Goal status: IN PROGRESS  4.  Pt will respond to 3 texts with simple 3-5 word message or emoji Baseline:  Goal status: IN PROGRESS  5.  Pt will improve score on Communication Participation Item Bank by 3 points Baseline: 9 Goal status: IN PROGRESS   ASSESSMENT:  CLINICAL IMPRESSION: Patient is a 57 y.o. 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 He reports significant frustration with his communication. Addressed sentence generation and response elaboration for close-ended questions. Pt able to demonstrate targeted techniques with occasional min A. Prior to CVA, Gabrielle enjoyed working on classic cars, [place], attended car shows. He has been getting together with some friends. Pt will continue to benefit from skilled ST to maximize communication for safety, independence and QOL.  OBJECTIVE IMPAIRMENTS: include aphasia. These impairments are limiting patient from return to work, Clinical cytogeneticist finances, household responsibilities, ADLs/IADLs, and effectively communicating at home and in community. Factors affecting potential to achieve goals and functional outcome are severity of impairments. Patient will benefit from skilled SLP services to address above impairments and improve overall function.  REHAB POTENTIAL: Good  PLAN:  SLP FREQUENCY: 2x/week  SLP DURATION: 12 weeks  PLANNED INTERVENTIONS: 92507 Treatment of speech (30 or 45 min) , Language facilitation, and Environmental controls    Amaka Gluth, Radene Journey, CCC-SLP 04/22/2023, 11:54 AM

## 2023-04-23 NOTE — Therapy (Unsigned)
OUTPATIENT SPEECH LANGUAGE PATHOLOGY APHASIA TREATMENT   Patient Name: Gerald Robles MRN: [id number] DOB:[birth year], 57 y.o., male Today's Date: 04/24/2023  PCP: Gerald Noble MD REFERRING PROVIDER: Marlowe Shores, MD  END OF SESSION:  End of Session - 04/24/23 1019     Visit Number 7    Number of Visits 25    Date for SLP Re-Evaluation 06/19/23    Authorization Type [facility] - 15 visits    Authorization Time Period 03/01/23-08/28/23    Authorization - Visit Number 6    Authorization - Number of Visits 15    SLP Start Time 1019    SLP Stop Time  1100    SLP Time Calculation (min) 41 min    Activity Tolerance Patient tolerated treatment well               Past Medical History:  Diagnosis Date   A-fib (HCC)    Anemia    Anemia    Arthritis    ED (erectile dysfunction)    Hypertension    Sarcoidosis 2003   Past Surgical History:  Procedure Laterality Date   CARDIOVERSION N/A 11/08/2020   Procedure: CARDIOVERSION;  Surgeon: Gerald Rives, MD;  Location: [facility] ENDOSCOPY;  Service: Cardiovascular;  Laterality: N/A;   KNEE ARTHROPLASTY Left 08/09/2016   Procedure: LEFT TOTAL KNEE ARTHROPLASTY WITH COMPUTER NAVIGATION;  Surgeon: Gerald Frederic, MD;  Location: [facility] ORS;  Service: Orthopedics;  Laterality: Left;  Needs RNFA   KNEE ARTHROPLASTY Right 12/13/2016   Procedure: RIGHT TOTAL KNEE ARTHROPLASTY WITH COMPUTER NAVIGATION;  Surgeon: Gerald Frederic, MD;  Location: [facility] ORS;  Service: Orthopedics;  Laterality: Right;  Needs RNFA   KNEE SURGERY Bilateral 1990   right acl with arthsroscopy; left kee artshroscopy    Right total knee     12/13/16 Dr. Linna Robles   SHOULDER SURGERY Right    twice   VASECTOMY     Patient Active Problem List   Diagnosis Date Noted   Hyponatremia 11/06/2020   Hypertension    Nonischemic cardiomyopathy (HCC)    Post-traumatic osteoarthritis of right knee 12/13/2016   Osteoarthritis of left knee 08/09/2016   Paroxysmal atrial fibrillation  (HCC) 05/04/2015    ONSET DATE: 03/18/2023 (referral date)  REFERRING DIAG:  Diagnosis  I63.9 (ICD-10-CM) - Cerebral infarction, unspecified   THERAPY DIAG: Aphasia  Rationale for Evaluation and Treatment: Rehabilitation  SUBJECTIVE:   SUBJECTIVE STATEMENT: Discussed Christmas gifts for sons with occasional cues required from wife  Pt accompanied by: significant other  PERTINENT HISTORY: The patient was admitted to [facility] 3/27-4/16/2024, initially presenting with dyspnea and hypoxia due to pneumonia, with hospitalization complicated by 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 His home rivaroxaban had been held the day prior due to a planned bronchoscopy. Follow-up MRI brain from 3/30, images personally reviewed, showed multiple acute infarcts in multiple vascular territories. He received inpatient rehab, then [facility] ST. He has not received ST for about 2 months per s/o.   PAIN: Are you having pain? No  OBJECTIVE:   TODAY'S TREATMENT:  04/24/23: Discussed specific Christmas gifts for his sons, with pt requiring occasional mod A for description of gift x1. Wife able to provide appropriate cues with rare min A. Further targeted use of description strategy for personally relevant words and common objects. Occasional to usual verbal prompting and visual cues required to aid specificity and thoroughness of descriptions. Endorsed difficulty completing task but agreeable to completion with limited frustration exhibited this session. Provided guiding framework and words to practice describing at home. Suggested both pt and wife provided descriptions for modeling and demonstration to aid pt accuracy.   04/22/23: Gerald Robles continues to have limited conversations with family only. Encouraged him to expand  communication partners to friends. Targeted sentence generation, thought organization and participation in conversation with conversation starter cards - in this less structured language task, Gerald Robles required frequent questioning cues to clarify vague, empty words. With extended time, and questioning cues he generated 2-3 details for each of the 6 questions. Frustration did not occur today. Reviewed strategies to manage frustration when communication break downs occur.   04/17/23: Endorsed some improvements in speech since initiating ST. Dicussed communication challenges at home, including intermittent frustration with anomia impeding discourse. Recommended using 5 intentional breaths to reduce frustration and caregivers using cueing hierarchy to aid anomia. Pt able to demonstrate intentional breathing to reduce frustration during structured tasks with mod I. Wife able to provide appropriate cues with rare prompting. Targeted response generation to close-ended conversational questions. Occasional repetitions required to aid auditory comprehension. Trialed pt repeating question prior to answering without successful carryover. Provided overt non-verbal and written cues to aid awareness of dysnomia, which was successful. Pt able to correct dysnomia with usual fading to rare min A today. Intermittently able to describe targeted words with guiding questions. Provided open-ended questions as part of HEP.   04/15/23: Targeted divergent naming of personally relevant category with occasional min A he named and wrote 15 car parts. Improved from 1st session. Targeted verbal compensations for aphasia using semantic feature analysis (SFA) - due to difficulty with words, modified task to use of photo cards with known category (animals) Gerald Robles required frequent mod questioning cues, visual cues (SFA chart) and written cues to generate 3 salient descriptions for 7 animals - extended time consistently  04/10/23: During informal  conversation, pt uses frequent vague speech and short responses (1-2 words) to answer SLP guiding questions. Addressed word finding and sentence generation with Scientist, product/process development (VNeST). For the verbs "eat" and "like", the patient generated 3 subject and objects for each verb, a total of 6 subjects and objects with usual mod-max questioning cues required. Pt generated 3 complex sentences for each verb by answering "wh" questions with occasional mod cues. Targeted response elaboration in picture description task, where pt generated complex sentences given occasional mod A. Pt exhibited occasional semantic paraphasias and perseveration, but was generally aware of errors and corrected with min-mod cues.   04/01/23: To target word finding, sentence generation,   Scientist, product/process development (VNeST) was utilized. The pt generated 3 subjects and objects for 4 verbs, (drive, throw, measure, deliver) for a total of 12 subject objects. Pt required occasional min to  mod semantic and questioning  cues. Pt generated 4 complex sentences by answering "wh" questions. Pt required occasional min semantic cues to generate complex sentences. Targeted divergent naming of car parts - [place]mont named 10 car parts with usual mod semantic cues.    03/27/23 (eval day): reviewed goals, provided homework   PATIENT EDUCATION:  Education details: See Today's Treatment, See patient instructions; aphasia compensations and education Person educated: Patient and Spouse Education method: Explanation, Demonstration, and Verbal cues Education comprehension: needs further education   GOALS: Goals reviewed with patient? Yes  SHORT TERM GOALS: Target date: 05/01/23  Pt will  name 10 items in personally relevant category with occasional min A Baseline: Goal status: IN PROGRESS  2.  Pt will employ verbal compensations for word finding in structured naming task with occasional min A Baseline:  Goal status: IN  PROGRESS  3.  Pt will generate moderately complex sentences in structured tasks such as Film/video editor with occasional min A Baseline:  Goal status: IN PROGRESS  4.  Pt will use compensations for aphasia to place order at unfamiliar restaurant with occasional min A Baseline:  Goal status: IN PROGRESS  5.  Pt will type/write simple 3-5 word sentence or response to train for texting  with occasional min A Baseline:  Goal status: IN PROGRESS   LONG TERM GOALS: Target date: 06/19/23  Pt will complete complex naming task with 90% accuracy and rare min A Baseline:  Goal status: IN PROGRESS  2.  Pt will use compensations for aphasia in conversation as needed 3/5 opportunities with occasional min A Baseline:  Goal status: IN PROGRESS  3.  Pt will use compensations for aphasia to place a simple business call and to order at 3 restaurants with rare min A from spouse. Baseline:  Goal status: IN PROGRESS  4.  Pt will respond to 3 texts with simple 3-5 word message or emoji Baseline:  Goal status: IN PROGRESS  5.  Pt will improve score on Communication Participation Item Bank by 3 points Baseline: 9 Goal status: IN PROGRESS   ASSESSMENT:  CLINICAL IMPRESSION: Patient is a 57 y.o. 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 He reports some improvements in communication since initiating ST, including ability to communicate more effectively with medical team. Targeted anomia compensations, with occasional to usual min to mod A required to aid naming. Prior to CVA, Shemuel enjoyed working on classic cars, [place], attended car shows. He has been getting together with some friends. Pt will continue to benefit from skilled ST to maximize communication for safety, independence and QOL.  OBJECTIVE IMPAIRMENTS: include aphasia. These impairments are limiting patient from return to work, Clinical cytogeneticist finances,  household responsibilities, ADLs/IADLs, and effectively communicating at home and in community. Factors affecting potential to achieve goals and functional outcome are severity of impairments. Patient will benefit from skilled SLP services to address above impairments and improve overall function.  REHAB POTENTIAL: Good  PLAN:  SLP FREQUENCY: 2x/week  SLP DURATION: 12 weeks  PLANNED INTERVENTIONS: 92507 Treatment of speech (30 or 45 min) , Language facilitation, and Environmental controls    Gracy Racer, CCC-SLP 04/24/2023, 11:03 AM

## 2023-04-24 ENCOUNTER — Ambulatory Visit: Payer: No Typology Code available for payment source

## 2023-04-24 DIAGNOSIS — R4701 Aphasia: Secondary | ICD-10-CM

## 2023-04-24 NOTE — Patient Instructions (Addendum)
Description:  What does it look like? What kind of item is it?  What do you do with it? Where do you find it? What is it made out of it?

## 2023-05-06 ENCOUNTER — Ambulatory Visit: Payer: No Typology Code available for payment source

## 2023-05-06 DIAGNOSIS — R4701 Aphasia: Secondary | ICD-10-CM | POA: Diagnosis not present

## 2023-05-06 NOTE — Therapy (Signed)
OUTPATIENT SPEECH LANGUAGE PATHOLOGY APHASIA TREATMENT   Patient Name: Gerald Robles MRN: [id number] DOB:[birth year], 57 y.o., male Today's Date: 05/06/2023  PCP: Marden Noble MD REFERRING PROVIDER: Marlowe Shores, MD  END OF SESSION:  End of Session - 05/06/23 1402     Visit Number 8    Number of Visits 25    Date for SLP Re-Evaluation 06/19/23    Authorization Type [facility] - 15 visits    Authorization Time Period 03/01/23-08/28/23    Authorization - Visit Number 7    Authorization - Number of Visits 15    SLP Start Time 1403    SLP Stop Time  1445    SLP Time Calculation (min) 42 min    Activity Tolerance Patient tolerated treatment well                Past Medical History:  Diagnosis Date   A-fib (HCC)    Anemia    Anemia    Arthritis    ED (erectile dysfunction)    Hypertension    Sarcoidosis 2003   Past Surgical History:  Procedure Laterality Date   CARDIOVERSION N/A 11/08/2020   Procedure: CARDIOVERSION;  Surgeon: Sande Rives, MD;  Location: [facility] ENDOSCOPY;  Service: Cardiovascular;  Laterality: N/A;   KNEE ARTHROPLASTY Left 08/09/2016   Procedure: LEFT TOTAL KNEE ARTHROPLASTY WITH COMPUTER NAVIGATION;  Surgeon: Samson Frederic, MD;  Location: [facility] ORS;  Service: Orthopedics;  Laterality: Left;  Needs RNFA   KNEE ARTHROPLASTY Right 12/13/2016   Procedure: RIGHT TOTAL KNEE ARTHROPLASTY WITH COMPUTER NAVIGATION;  Surgeon: Samson Frederic, MD;  Location: [facility] ORS;  Service: Orthopedics;  Laterality: Right;  Needs RNFA   KNEE SURGERY Bilateral 1990   right acl with arthsroscopy; left kee artshroscopy    Right total knee     12/13/16 Dr. Linna Caprice   SHOULDER SURGERY Right    twice   VASECTOMY     Patient Active Problem List   Diagnosis Date Noted   Hyponatremia 11/06/2020   Hypertension    Nonischemic cardiomyopathy (HCC)    Post-traumatic osteoarthritis of right knee 12/13/2016   Osteoarthritis of left knee 08/09/2016   Paroxysmal atrial fibrillation  (HCC) 05/04/2015    ONSET DATE: 03/18/2023 (referral date)  REFERRING DIAG:  Diagnosis  I63.9 (ICD-10-CM) - Cerebral infarction, unspecified   THERAPY DIAG: Aphasia  Rationale for Evaluation and Treatment: Rehabilitation  SUBJECTIVE:   SUBJECTIVE STATEMENT: "it's going good" Pt accompanied by: significant other  PERTINENT HISTORY: The patient was admitted to [facility] 3/27-4/16/2024, initially presenting with dyspnea and hypoxia due to pneumonia, with hospitalization complicated by 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 His home rivaroxaban had been held the day prior due to a planned bronchoscopy. Follow-up MRI brain from 3/30, images personally reviewed, showed multiple acute infarcts in multiple vascular territories. He received inpatient rehab, then [facility] ST. He has not received ST for about 2 months per s/o.   PAIN: Are you having pain? No  OBJECTIVE:   TODAY'S TREATMENT:  05/06/23: Reported increased communication effectiveness at home. Pt self-advocating for extra processing time with success. Targeted writing and typing as pt currently deferring texting to his wife. Trialed texting common word with max A required. Wife questioned visual component. Able to type targeted word, with verbal coaching required to ID letters. Scaffolded task to written words, with occasional mod A fading to rare mod A required for writing pertinent words. Demonstrated good awareness of errors with occasional cues required to correct. Suggested focusing on writing/typing as part of HEP as this is important to patient.   04/24/23: Discussed specific Christmas gifts for his sons, with pt requiring occasional mod A for description of gift x1. Wife able to provide appropriate cues with rare min A. Further targeted use of  description strategy for personally relevant words and common objects. Occasional to usual verbal prompting and visual cues required to aid specificity and thoroughness of descriptions. Endorsed difficulty completing task but agreeable to completion with limited frustration exhibited this session. Provided guiding framework and words to practice describing at home. Suggested both pt and wife provided descriptions for modeling and demonstration to aid pt accuracy.   04/22/23: Brandom continues to have limited conversations with family only. Encouraged him to expand communication partners to friends. Targeted sentence generation, thought organization and participation in conversation with conversation starter cards - in this less structured language task, Carmichael required frequent questioning cues to clarify vague, empty words. With extended time, and questioning cues he generated 2-3 details for each of the 6 questions. Frustration did not occur today. Reviewed strategies to manage frustration when communication break downs occur.   04/17/23: Endorsed some improvements in speech since initiating ST. Dicussed communication challenges at home, including intermittent frustration with anomia impeding discourse. Recommended using 5 intentional breaths to reduce frustration and caregivers using cueing hierarchy to aid anomia. Pt able to demonstrate intentional breathing to reduce frustration during structured tasks with mod I. Wife able to provide appropriate cues with rare prompting. Targeted response generation to close-ended conversational questions. Occasional repetitions required to aid auditory comprehension. Trialed pt repeating question prior to answering without successful carryover. Provided overt non-verbal and written cues to aid awareness of dysnomia, which was successful. Pt able to correct dysnomia with usual fading to rare min A today. Intermittently able to describe targeted words with guiding questions.  Provided open-ended questions as part of HEP.   04/15/23: Targeted divergent naming of personally relevant category with occasional min A he named and wrote 15 car parts. Improved from 1st session. Targeted verbal compensations for aphasia using semantic feature analysis (SFA) - due to difficulty with words, modified task to use of photo cards with known category (animals) [name] required frequent mod questioning cues, visual cues (SFA chart) and written cues to generate 3 salient descriptions for 7 animals - extended time consistently  04/10/23: During informal conversation, pt uses frequent vague speech and short responses (1-2 words) to answer SLP guiding questions. Addressed word finding and sentence generation with Scientist, product/process development (VNeST). For the verbs "eat" and "like", the patient generated 3 subject and objects for each verb, a total of 6 subjects and objects with usual mod-max questioning cues required. Pt generated 3 complex sentences for each verb by answering "wh" questions with occasional mod cues. Targeted response elaboration in picture description task, where pt generated complex sentences given occasional mod A. Pt exhibited occasional semantic paraphasias and perseveration, but was generally aware of errors and corrected with min-mod cues.   04/01/23: To  target word finding, sentence generation,   Scientist, product/process development (VNeST) was utilized. The pt generated 3 subjects and objects for 4 verbs, (drive, throw, measure, deliver) for a total of 12 subject objects. Pt required occasional min to  mod semantic and questioning  cues. Pt generated 4 complex sentences by answering "wh" questions. Pt required occasional min semantic cues to generate complex sentences. Targeted divergent naming of car parts - Denis named 10 car parts with usual mod semantic cues.    03/27/23 (eval day): reviewed goals, provided homework   PATIENT EDUCATION: Education details: See  Today's Treatment, See patient instructions; aphasia compensations and education Person educated: Patient and Spouse Education method: Explanation, Demonstration, and Verbal cues Education comprehension: needs further education   GOALS: Goals reviewed with patient? Yes  SHORT TERM GOALS: Target date: 05/01/23  Pt will  name 10 items in personally relevant category with occasional min A Baseline: Goal status: PARTIALLY MET  2.  Pt will employ verbal compensations for word finding in structured naming task with occasional min A Baseline:  Goal status: MET  3.  Pt will generate moderately complex sentences in structured tasks such as Film/video editor with occasional min A Baseline:  Goal status: NOT MET  4.  Pt will use compensations for aphasia to place order at unfamiliar restaurant with occasional min A Baseline:  Goal status: NOT ADDRESSED   5.  Pt will type/write simple 3-5 word sentence or response to train for texting  with occasional min A Baseline:  Goal status: PARTIALLY MET   LONG TERM GOALS: Target date: 06/19/23  Pt will complete complex naming task with 90% accuracy and rare min A Baseline:  Goal status: IN PROGRESS  2.  Pt will use compensations for aphasia in conversation as needed 3/5 opportunities with occasional min A Baseline:  Goal status: IN PROGRESS  3.  Pt will use compensations for aphasia to place a simple business call and to order at 3 restaurants with rare min A from spouse. Baseline:  Goal status: IN PROGRESS  4.  Pt will respond to 3 texts with simple 3-5 word message or emoji Baseline:  Goal status: IN PROGRESS  5.  Pt will improve score on Communication Participation Item Bank by 3 points Baseline: 9 Goal status: IN PROGRESS   ASSESSMENT:  CLINICAL IMPRESSION: Patient is a 57 y.o. 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 He reports some  improvements in communication since initiating ST, including ability to communicate more effectively with medical team. Targeted writing this session, with usual fading to rare mod A required at word level. Pt will continue to benefit from skilled ST to maximize communication for safety, independence and QOL.  OBJECTIVE IMPAIRMENTS: include aphasia. These impairments are limiting patient from return to work, Clinical cytogeneticist finances, household responsibilities, ADLs/IADLs, and effectively communicating at home and in community. Factors affecting potential to achieve goals and functional outcome are severity of impairments. Patient will benefit from skilled SLP services to address above impairments and improve overall function.  REHAB POTENTIAL: Good  PLAN:  SLP FREQUENCY: 2x/week  SLP DURATION: 12 weeks  PLANNED INTERVENTIONS: 92507 Treatment of speech (30 or 45 min) , Language facilitation, and Environmental controls    Gracy Racer, CCC-SLP 05/06/2023, 2:02 PM

## 2023-05-13 ENCOUNTER — Ambulatory Visit: Payer: No Typology Code available for payment source | Admitting: Speech Pathology

## 2023-05-14 NOTE — Therapy (Signed)
 OUTPATIENT SPEECH LANGUAGE PATHOLOGY APHASIA TREATMENT   Patient Name: Gerald Robles MRN: [id number] DOB:[birth year], 58 y.o., male Today's Date: 05/15/2023  PCP: Delice Charleston MD REFERRING PROVIDER: Darren Monetta RAMAN, MD  END OF SESSION:  End of Session - 05/15/23 1147     Visit Number 9    Number of Visits 25    Date for SLP Re-Evaluation 06/19/23    Authorization Type [facility] - 15 visits    Authorization Time Period 03/01/23-08/28/23    Authorization - Visit Number 8    Authorization - Number of Visits 15    SLP Start Time 1147    SLP Stop Time  1230    SLP Time Calculation (min) 43 min    Activity Tolerance Patient tolerated treatment well                 Past Medical History:  Diagnosis Date   A-fib (HCC)    Anemia    Anemia    Arthritis    ED (erectile dysfunction)    Hypertension    Sarcoidosis 2003   Past Surgical History:  Procedure Laterality Date   CARDIOVERSION N/A 11/08/2020   Procedure: CARDIOVERSION;  Surgeon: Barbaraann Darryle Ned, MD;  Location: [facility] ENDOSCOPY;  Service: Cardiovascular;  Laterality: N/A;   KNEE ARTHROPLASTY Left 08/09/2016   Procedure: LEFT TOTAL KNEE ARTHROPLASTY WITH COMPUTER NAVIGATION;  Surgeon: Redell Shoals, MD;  Location: [facility] ORS;  Service: Orthopedics;  Laterality: Left;  Needs RNFA   KNEE ARTHROPLASTY Right 12/13/2016   Procedure: RIGHT TOTAL KNEE ARTHROPLASTY WITH COMPUTER NAVIGATION;  Surgeon: Shoals Redell, MD;  Location: [facility] ORS;  Service: Orthopedics;  Laterality: Right;  Needs RNFA   KNEE SURGERY Bilateral 1990   right acl with arthsroscopy; left kee artshroscopy    Right total knee     12/13/16 Dr. Shoals   SHOULDER SURGERY Right    twice   VASECTOMY     Patient Active Problem List   Diagnosis Date Noted   Hyponatremia 11/06/2020   Hypertension    Nonischemic cardiomyopathy (HCC)    Post-traumatic osteoarthritis of right knee 12/13/2016   Osteoarthritis of left knee 08/09/2016   Paroxysmal atrial fibrillation  (HCC) 05/04/2015    ONSET DATE: 03/18/2023 (referral date)  REFERRING DIAG:  Diagnosis  I63.9 (ICD-10-CM) - Cerebral infarction, unspecified   THERAPY DIAG: Aphasia  Rationale for Evaluation and Treatment: Rehabilitation  SUBJECTIVE:   SUBJECTIVE STATEMENT: just watched the news  Pt accompanied by: significant other  PERTINENT HISTORY: The patient was admitted to [facility] 3/27-4/16/2024, initially presenting with dyspnea and hypoxia due to pneumonia, with hospitalization complicated by 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 His home rivaroxaban  had been held the day prior due to a planned bronchoscopy. Follow-up MRI brain from 3/30, images personally reviewed, showed multiple acute infarcts in multiple vascular territories. He received inpatient rehab, then [facility] ST. He has not received ST for about 2 months per s/o.   PAIN: Are you having pain? No  OBJECTIVE:   TODAY'S TREATMENT:  05/15/23: Completed some writing at home. Wife observed improved accuracy when cued to spell aloud. Targeted writing personally relevant words and phrases with rare mod A required. Improved since last session. Targeted sentence generation given specific word. Pt required occasional modeling and usual min cues to generate appropriate sentence. Demonstrating improved frustration tolerance and use of self-advocacy (ex: give me a minute). Provided positive reinforcement of strategy use. Generated individualized conversational questions to encourage phone conversations with friends. Updated HEP to call friends x2 before next session with use of scripts.    05/06/23: Reported increased communication effectiveness at home. Pt self-advocating for extra processing time with success. Targeted writing and typing as pt currently  deferring texting to his wife. Trialed texting common word with max A required. Wife questioned visual component. Able to type targeted word, with verbal coaching required to ID letters. Scaffolded task to written words, with occasional mod A fading to rare mod A required for writing pertinent words. Demonstrated good awareness of errors with occasional cues required to correct. Suggested focusing on writing/typing as part of HEP as this is important to patient.   04/24/23: Discussed specific Christmas gifts for his sons, with pt requiring occasional mod A for description of gift x1. Wife able to provide appropriate cues with rare min A. Further targeted use of description strategy for personally relevant words and common objects. Occasional to usual verbal prompting and visual cues required to aid specificity and thoroughness of descriptions. Endorsed difficulty completing task but agreeable to completion with limited frustration exhibited this session. Provided guiding framework and words to practice describing at home. Suggested both pt and wife provided descriptions for modeling and demonstration to aid pt accuracy.   04/22/23: Yader continues to have limited conversations with family only. Encouraged him to expand communication partners to friends. Targeted sentence generation, thought organization and participation in conversation with conversation starter cards - in this less structured language task, Rayner required frequent questioning cues to clarify vague, empty words. With extended time, and questioning cues he generated 2-3 details for each of the 6 questions. Frustration did not occur today. Reviewed strategies to manage frustration when communication break downs occur.   04/17/23: Endorsed some improvements in speech since initiating ST. Dicussed communication challenges at home, including intermittent frustration with anomia impeding discourse. Recommended using 5 intentional breaths to reduce  frustration and caregivers using cueing hierarchy to aid anomia. Pt able to demonstrate intentional breathing to reduce frustration during structured tasks with mod I. Wife able to provide appropriate cues with rare prompting. Targeted response generation to close-ended conversational questions. Occasional repetitions required to aid auditory comprehension. Trialed pt repeating question prior to answering without successful carryover. Provided overt non-verbal and written cues to aid awareness of dysnomia, which was successful. Pt able to correct dysnomia with usual fading to rare min A today. Intermittently able to describe targeted words with guiding questions. Provided open-ended questions as part of HEP.   04/15/23: Targeted divergent naming of personally relevant category with occasional min A he named and wrote 15 car parts. Improved from 1st session. Targeted verbal compensations for aphasia using semantic feature analysis (SFA) - due to difficulty with words, modified task to use of photo cards with known category (animals) GLENWOOD Alm required frequent mod questioning cues, visual cues (SFA chart) and written cues to generate 3 salient descriptions for 7 animals - extended time consistently  04/10/23: During informal conversation, pt uses frequent vague speech and short responses (1-2 words) to answer SLP guiding questions. Addressed  word finding and sentence generation with Scientist, Product/process Development (VNeST). For the verbs eat and like, the patient generated 3 subject and objects for each verb, a total of 6 subjects and objects with usual mod-max questioning cues required. Pt generated 3 complex sentences for each verb by answering wh questions with occasional mod cues. Targeted response elaboration in picture description task, where pt generated complex sentences given occasional mod A. Pt exhibited occasional semantic paraphasias and perseveration, but was generally aware of errors and  corrected with min-mod cues.   04/01/23: To target word finding, sentence generation,   Scientist, Product/process Development (VNeST) was utilized. The pt generated 3 subjects and objects for 4 verbs, (drive, throw, measure, deliver) for a total of 12 subject objects. Pt required occasional min to  mod semantic and questioning  cues. Pt generated 4 complex sentences by answering wh questions. Pt required occasional min semantic cues to generate complex sentences. Targeted divergent naming of car parts - Teagan named 10 car parts with usual mod semantic cues.    03/27/23 (eval day): reviewed goals, provided homework   PATIENT EDUCATION: Education details: See Today's Treatment, See patient instructions; aphasia compensations and education Person educated: Patient and Spouse Education method: Explanation, Demonstration, and Verbal cues Education comprehension: needs further education   GOALS: Goals reviewed with patient? Yes  SHORT TERM GOALS: Target date: 05/01/23  Pt will  name 10 items in personally relevant category with occasional min A Baseline: Goal status: PARTIALLY MET  2.  Pt will employ verbal compensations for word finding in structured naming task with occasional min A Baseline:  Goal status: MET  3.  Pt will generate moderately complex sentences in structured tasks such as Film/video Editor with occasional min A Baseline:  Goal status: NOT MET  4.  Pt will use compensations for aphasia to place order at unfamiliar restaurant with occasional min A Baseline:  Goal status: NOT ADDRESSED   5.  Pt will type/write simple 3-5 word sentence or response to train for texting  with occasional min A Baseline:  Goal status: PARTIALLY MET   LONG TERM GOALS: Target date: 06/19/23  Pt will complete complex naming task with 90% accuracy and rare min A Baseline:  Goal status: IN PROGRESS  2.  Pt will use compensations for aphasia in conversation as needed 3/5  opportunities with occasional min A Baseline:  Goal status: IN PROGRESS  3.  Pt will use compensations for aphasia to place a simple business call and to order at 3 restaurants with rare min A from spouse. Baseline:  Goal status: IN PROGRESS  4.  Pt will respond to 3 texts with simple 3-5 word message or emoji Baseline:  Goal status: IN PROGRESS  5.  Pt will improve score on Communication Participation Item Bank by 3 points Baseline: 9 Goal status: IN PROGRESS   ASSESSMENT:  CLINICAL IMPRESSION: Patient is a 58 y.o. 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 He demonstrates improvements in communication since initiating ST, including ability to communicate more effectively in conversation. Targeted writing and scripting this session. Pt will continue to benefit from skilled ST to maximize communication for safety, independence and QOL.  OBJECTIVE IMPAIRMENTS: include aphasia. These impairments are limiting patient from return to work, clinical cytogeneticist finances, household responsibilities, ADLs/IADLs, and effectively communicating at home and in community. Factors affecting potential to achieve goals and functional outcome are severity of impairments. Patient will  benefit from skilled SLP services to address above impairments and improve overall function.  REHAB POTENTIAL: Good  PLAN:  SLP FREQUENCY: 2x/week  SLP DURATION: 12 weeks  PLANNED INTERVENTIONS: 92507 Treatment of speech (30 or 45 min) , Language facilitation, and Environmental controls    Comer LILLETTE Louder, CCC-SLP 05/15/2023, 11:48 AM

## 2023-05-15 ENCOUNTER — Ambulatory Visit: Payer: No Typology Code available for payment source | Attending: Internal Medicine

## 2023-05-15 DIAGNOSIS — R4701 Aphasia: Secondary | ICD-10-CM | POA: Insufficient documentation

## 2023-05-15 NOTE — Patient Instructions (Signed)
 Scotty: How are you doing? How's retirement? What's the deal with the divorce?   Chucky: How's work going? When are you retiring? How's your family?

## 2023-05-20 ENCOUNTER — Ambulatory Visit: Payer: No Typology Code available for payment source | Admitting: Speech Pathology

## 2023-05-20 DIAGNOSIS — R4701 Aphasia: Secondary | ICD-10-CM

## 2023-05-20 NOTE — Therapy (Signed)
 OUTPATIENT SPEECH LANGUAGE PATHOLOGY APHASIA TREATMENT   Patient Name: [facility] MRN: [id number] DOB:1965-07-17, 58 y.o., male Today's Date: 05/20/2023  PCP: Gerald Charleston MD REFERRING PROVIDER: Darren Robles RAMAN, MD  END OF SESSION:  End of Session - 05/20/23 1159     Visit Number 10    Number of Visits 25    Date for SLP Re-Evaluation 06/19/23    Authorization Type [facility] - 15 visits    Authorization Time Period 03/01/23-08/28/23    Authorization - Visit Number 9    Authorization - Number of Visits 15    SLP Start Time 1015    SLP Stop Time  1100    SLP Time Calculation (min) 45 min    Activity Tolerance Patient tolerated treatment well                  Past Medical History:  Diagnosis Date   A-fib (HCC)    Anemia    Anemia    Arthritis    ED (erectile dysfunction)    Hypertension    Sarcoidosis 2003   Past Surgical History:  Procedure Laterality Date   CARDIOVERSION N/A 11/08/2020   Procedure: CARDIOVERSION;  Surgeon: Gerald Gerald Ned, MD;  Location: [facility];  Service: Cardiovascular;  Laterality: N/A;   KNEE ARTHROPLASTY Left 08/09/2016   Procedure: LEFT TOTAL KNEE ARTHROPLASTY WITH COMPUTER NAVIGATION;  Surgeon: Gerald Shoals, MD;  [facility]: [facility];  Service: Orthopedics;  Laterality: Left;  Needs RNFA   KNEE ARTHROPLASTY Right 12/13/2016   Procedure: RIGHT TOTAL KNEE ARTHROPLASTY WITH COMPUTER NAVIGATION;  Surgeon: Robles Redell, MD;  [facility]: [facility];  Service: Orthopedics;  Laterality: Right;  Needs RNFA   KNEE SURGERY Bilateral 1990   right acl with arthsroscopy; left kee artshroscopy    Right total knee     12/13/16 Dr. Shoals   SHOULDER SURGERY Right    twice   VASECTOMY     Patient Active Problem List   Diagnosis Date Noted   Hyponatremia 11/06/2020   Hypertension    Nonischemic cardiomyopathy (HCC)    Post-traumatic osteoarthritis of right knee 12/13/2016   Osteoarthritis of left knee 08/09/2016   Paroxysmal atrial  fibrillation (HCC) 05/04/2015    ONSET DATE: 03/18/2023 (referral date)  REFERRING DIAG:  Diagnosis  I63.9 (ICD-10-CM) - Cerebral infarction, unspecified   THERAPY DIAG: Aphasia  Rationale for Evaluation and Treatment: Rehabilitation  SUBJECTIVE:   SUBJECTIVE STATEMENT: It's getting better  Pt accompanied by: significant other  PERTINENT HISTORY: The patient was [facility] to [facility]27-4/16/2024, initially presenting with dyspnea and hypoxia due to pneumonia, with hospitalization complicated by 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 His home rivaroxaban  had been held the day prior due to a planned bronchoscopy. Follow-up MRI brain from 3/30, images personally reviewed, showed multiple acute infarcts in multiple vascular territories. He received inpatient rehab, then [facility]. He has not received ST for about 2 months per s/o.   PAIN: Are you having pain? No   TODAY'S TREATMENT:  05/20/23:  He has not made phone calls yet. Targeted complex naming and written expression generating 1 item in lower frequency category with a given letter [name] required usual mod semantic cues for naming 4/5. For writing, he continues to benefit from spelling aloud 5/5 words. Targeted written expression at word level for personally relevant words (deli items, rock bands) - he required usual mod semantic cues to generate words, occasional min cues to ID and self correct written errors. In conversation, [name] required questioning cues to generate specific rather than vague empty words.   05/15/23: Completed some writing at home. Wife observed improved accuracy when cued to spell aloud. Targeted writing personally relevant words and phrases with rare mod A required. Improved since last session. Targeted sentence  generation given specific word. Pt required occasional modeling and usual min cues to generate appropriate sentence. Demonstrating improved frustration tolerance and use of self-advocacy (ex: give me a minute). Provided positive reinforcement of strategy use. Generated individualized conversational questions to encourage phone conversations with friends. Updated HEP to call friends x2 before next session with use of scripts.    05/06/23: Reported increased communication effectiveness at home. Pt self-advocating for extra processing time with success. Targeted writing and typing as pt currently deferring texting to his wife. Trialed texting common word with max A required. Wife questioned visual component. Able to type targeted word, with verbal coaching required to ID letters. Scaffolded task to written words, with occasional mod A fading to rare mod A required for writing pertinent words. Demonstrated good awareness of errors with occasional cues required to correct. Suggested focusing on writing/typing as part of HEP as this is important to patient.   04/24/23: Discussed specific Christmas gifts for his sons, with pt requiring occasional mod A for description of gift x1. Wife able to provide appropriate cues with rare min A. Further targeted use of description strategy for personally relevant words and common objects. Occasional to usual verbal prompting and visual cues required to aid specificity and thoroughness of descriptions. Endorsed difficulty completing task but agreeable to completion with limited frustration exhibited this session. Provided guiding framework and words to practice describing at home. Suggested both pt and wife provided descriptions for modeling and demonstration to aid pt accuracy.   04/22/23: [name] continues to have limited conversations with family only. Encouraged him to expand communication partners to friends. Targeted sentence generation, thought organization and  participation in conversation with conversation starter cards - in this less structured language task, [name] required frequent questioning cues to clarify vague, empty words. With extended time, and questioning cues he generated 2-3 details for each of the 6 questions. Frustration did not occur today. Reviewed strategies to manage frustration when communication break downs occur.   04/17/23: Endorsed some improvements in speech since initiating ST. Dicussed communication challenges at home, including intermittent frustration with anomia impeding discourse. Recommended using 5 intentional breaths to reduce frustration and caregivers using cueing hierarchy to aid anomia. Pt able to demonstrate intentional breathing to reduce frustration during structured tasks with mod I. Wife able to provide appropriate cues with rare prompting. Targeted response generation to close-ended conversational questions. Occasional repetitions required to aid auditory comprehension. Trialed pt repeating question prior to answering without successful carryover. Provided overt non-verbal and written cues to aid awareness of dysnomia, which was successful. Pt able to correct dysnomia with usual fading to rare min A today. Intermittently able to describe targeted words with guiding questions. Provided open-ended questions as part of HEP.  04/15/23: Targeted divergent naming of personally relevant category with occasional min A he named and wrote 15 car parts. Improved from 1st session. Targeted verbal compensations for aphasia using semantic feature analysis (SFA) - due to difficulty with words, modified task to use of photo cards with known category (animals) [name] required frequent mod questioning cues, visual cues (SFA chart) and written cues to generate 3 salient descriptions for 7 animals - extended time consistently  04/10/23: During informal conversation, pt uses frequent vague speech and short responses (1-2 words) to answer SLP  guiding questions. Addressed word finding and sentence generation with Scientist, Product/process Development ([employer]Ne[employer]). For the verbs eat and like, the patient generated 3 subject and objects for each verb, a total of 6 subjects and objects with usual mod-max questioning cues required. Pt generated 3 complex sentences for each verb by answering wh questions with occasional mod cues. Targeted response elaboration in picture description task, where pt generated complex sentences given occasional mod A. Pt exhibited occasional semantic paraphasias and perseveration, but was generally aware of errors and corrected with min-mod cues.   04/01/23: To target word finding, sentence generation,   Scientist, Product/process Development (VNeST) was utilized. The pt generated 3 subjects and objects for 4 verbs, (drive, throw, measure, deliver) for a total of 12 subject objects. Pt required occasional min to  mod semantic and questioning  cues. Pt generated 4 complex sentences by answering wh questions. Pt required occasional min semantic cues to generate complex sentences. Targeted divergent naming of car parts - Nicko named 10 car parts with usual mod semantic cues.    03/27/23 (eval day): reviewed goals, provided homework   PATIENT EDUCATION: Education details: See Today's Treatment, See patient instructions; aphasia compensations and education Person educated: Patient and Spouse Education method: Explanation, Demonstration, and Verbal cues Education comprehension: needs further education   GOALS: Goals reviewed with patient? Yes  SHORT TERM GOALS: Target date: 05/01/23  Pt will  name 10 items in personally relevant category with occasional min A Baseline: Goal status: PARTIALLY MET  2.  Pt will employ verbal compensations for word finding in structured naming task with occasional min A Baseline:  Goal status: MET  3.  Pt will generate moderately complex sentences in structured tasks such as Actor with occasional min A Baseline:  Goal status: NOT MET  4.  Pt will use compensations for aphasia to place order at unfamiliar restaurant with occasional min A Baseline:  Goal status: NOT ADDRESSED   5.  Pt will type/write simple 3-5 word sentence or response to train for texting  with occasional min A Baseline:  Goal status: PARTIALLY MET   LONG TERM GOALS: Target date: 06/19/23  Pt will complete complex naming task with 90% accuracy and rare min A Baseline:  Goal status: IN PROGRESS  2.  Pt will use compensations for aphasia in conversation as needed 3/5 opportunities with occasional min A Baseline:  Goal status: IN PROGRESS  3.  Pt will use compensations for aphasia to place a simple business call and to order at 3 restaurants with rare min A from spouse. Baseline:  Goal status: IN PROGRESS  4.  Pt will respond to 3 texts with simple 3-5 word message or emoji Baseline:  Goal status: IN PROGRESS  5.  Pt will improve score on Communication Participation Item Bank by 3 points Baseline: 9 Goal status: IN PROGRESS   ASSESSMENT:  CLINICAL IMPRESSION: Patient is a 58 y.o. 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 He demonstrates improvements in communication since initiating ST, including ability to communicate more effectively in conversation. Targeted writing and scripting this session. Pt will continue to benefit from skilled ST to maximize communication for safety, independence and QOL.  OBJECTIVE IMPAIRMENTS: include aphasia. These impairments are limiting patient from return to work, clinical cytogeneticist finances, household responsibilities, ADLs/IADLs, and effectively communicating at home and in community. Factors affecting potential to achieve goals and functional outcome are severity of impairments. Patient will benefit from skilled SLP services to address above impairments  and improve overall function.  REHAB POTENTIAL: Good  PLAN:  SLP FREQUENCY: 2x/week  SLP DURATION: 12 weeks  PLANNED INTERVENTIONS: 92507 Treatment of speech (30 or 45 min) , Language facilitation, and Environmental controls    Analisia Kingsford, Leita Caldron, CCC-SLP 05/20/2023, 12:00 PM

## 2023-05-22 ENCOUNTER — Ambulatory Visit: Payer: No Typology Code available for payment source | Admitting: Speech Pathology

## 2023-05-22 ENCOUNTER — Encounter: Payer: Self-pay | Admitting: Speech Pathology

## 2023-05-22 DIAGNOSIS — R4701 Aphasia: Secondary | ICD-10-CM | POA: Diagnosis not present

## 2023-05-22 NOTE — Therapy (Signed)
 OUTPATIENT SPEECH LANGUAGE PATHOLOGY APHASIA TREATMENT   Patient Name: Gerald Robles MRN: [id number] DOB:1965/12/03, 58 y.o., male Today's Date: 05/22/2023  PCP: Berta Brittle MD REFERRING PROVIDER: Joanette Moynahan, MD  END OF SESSION:  End of Session - 05/22/23 1406     Visit Number 11    Number of Visits 25    Date for SLP Re-Evaluation 06/19/23    Authorization Type [facility] - 15 visits    Authorization Time Period 03/01/23-08/28/23    Authorization - Visit Number 11    Authorization - Number of Visits 15    SLP Start Time 1400    SLP Stop Time  1445    SLP Time Calculation (min) 45 min    Activity Tolerance Patient tolerated treatment well                  Past Medical History:  Diagnosis Date   A-fib (HCC)    Anemia    Anemia    Arthritis    ED (erectile dysfunction)    Hypertension    Sarcoidosis 2003   Past Surgical History:  Procedure Laterality Date   CARDIOVERSION N/A 11/08/2020   Procedure: CARDIOVERSION;  Surgeon: Harrold Lincoln, MD;  Location: [facility] ENDOSCOPY;  Service: Cardiovascular;  Laterality: N/A;   KNEE ARTHROPLASTY Left 08/09/2016   Procedure: LEFT TOTAL KNEE ARTHROPLASTY WITH COMPUTER NAVIGATION;  Surgeon: Adonica Hoose, MD;  Location: [facility] ORS;  Service: Orthopedics;  Laterality: Left;  Needs RNFA   KNEE ARTHROPLASTY Right 12/13/2016   Procedure: RIGHT TOTAL KNEE ARTHROPLASTY WITH COMPUTER NAVIGATION;  Surgeon: Adonica Hoose, MD;  Location: [facility] ORS;  Service: Orthopedics;  Laterality: Right;  Needs RNFA   KNEE SURGERY Bilateral 1990   right acl with arthsroscopy; left kee artshroscopy    Right total knee     12/13/16 Dr. Charol Copas   SHOULDER SURGERY Right    twice   VASECTOMY     Patient Active Problem List   Diagnosis Date Noted   Hyponatremia 11/06/2020   Hypertension    Nonischemic cardiomyopathy (HCC)    Post-traumatic osteoarthritis of right knee 12/13/2016   Osteoarthritis of left knee 08/09/2016   Paroxysmal atrial  fibrillation (HCC) 05/04/2015    ONSET DATE: 03/18/2023 (referral date)  REFERRING DIAG:  Diagnosis  I63.9 (ICD-10-CM) - Cerebral infarction, unspecified   THERAPY DIAG: Aphasia  Rationale for Evaluation and Treatment: Rehabilitation  SUBJECTIVE:   SUBJECTIVE STATEMENT: "When we talk at home there is no problem"  Pt accompanied by: significant other  PERTINENT HISTORY: The patient was admitted to [facility] 3/27-4/16/2024, initially presenting with dyspnea and hypoxia due to pneumonia, with hospitalization complicated by 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 His home rivaroxaban  had been held the day prior due to a planned bronchoscopy. Follow-up MRI brain from 3/30, images personally reviewed, showed multiple acute infarcts in multiple vascular territories. He received inpatient rehab, then [facility] ST. He has not received ST for about 2 months per s/o.   PAIN: Are you having pain? No   TODAY'S TREATMENT:  05/22/23: Targeted word finding and verbal compensations for aphasia generating 3 similarities and differences of personally relevant categories (metal rock/country; sedan/pick up; camper/RV) With usual min to mod questioning and semantic cues, [name] generated 3-4 similarities and differences for each pair. 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 Conversation re: extended family Alixander named 6 members of extended family with supervision cues. 1 verbal cue from [name] to elicit specific example rather than empty language.    05/20/23:  He has not made phone calls yet. Targeted complex naming and written expression generating 1 item in lower frequency category with a given letter [name] required usual mod semantic cues for naming 4/5. For writing, he  continues to benefit from spelling aloud 5/5 words. Targeted written expression at word level for personally relevant words (deli items, rock bands) - he required usual mod semantic cues to generate words, occasional min cues to ID and self correct written errors. In conversation, [name] required questioning cues to generate specific rather than vague empty words.   05/15/23: Completed some writing at home. Wife observed improved accuracy when cued to spell aloud. Targeted writing personally relevant words and phrases with rare mod A required. Improved since last session. Targeted sentence generation given specific word. Pt required occasional modeling and usual min cues to generate appropriate sentence. Demonstrating improved frustration tolerance and use of self-advocacy (ex: "give me a minute"). Provided positive reinforcement of strategy use. Generated individualized conversational questions to encourage phone conversations with friends. Updated HEP to call friends x2 before next session with use of scripts.    05/06/23: Reported increased communication effectiveness at home. Pt self-advocating for extra processing time with success. Targeted writing and typing as pt currently deferring texting to his wife. Trialed texting common word with max A required. Wife questioned visual component. Able to type targeted word, with verbal coaching required to ID letters. Scaffolded task to written words, with occasional mod A fading to rare mod A required for writing pertinent words. Demonstrated good awareness of errors with occasional cues required to correct. Suggested focusing on writing/typing as part of HEP as this is important to patient.   04/24/23: Discussed specific Christmas gifts for his sons, with pt requiring occasional mod A for description of gift x1. Wife able to provide appropriate cues with rare min A. Further targeted use of description strategy for personally relevant words and common objects.  Occasional to usual verbal prompting and visual cues required to aid specificity and thoroughness of descriptions. Endorsed difficulty completing task but agreeable to completion with limited frustration exhibited this session. Provided guiding framework and words to practice describing at home. Suggested both pt and wife provided descriptions for modeling and demonstration to aid pt accuracy.   04/22/23: Kaydan continues to have limited conversations with family only. Encouraged him to expand communication partners to friends. Targeted sentence generation, thought organization and participation in conversation with conversation starter cards - in this less structured language task, Santhosh required frequent questioning cues to clarify vague, empty words. With extended time, and questioning cues he generated 2-3 details for each of the 6 questions. Frustration did not occur today. Reviewed strategies to manage frustration when communication break downs occur.   04/17/23: Endorsed some improvements in speech since initiating ST. Dicussed communication challenges at home, including intermittent frustration with anomia impeding discourse. Recommended using 5 intentional breaths to reduce frustration and caregivers using cueing hierarchy to aid anomia. Pt able to demonstrate intentional breathing to  reduce frustration during structured tasks with mod I. Wife able to provide appropriate cues with rare prompting. Targeted response generation to close-ended conversational questions. Occasional repetitions required to aid auditory comprehension. Trialed pt repeating question prior to answering without successful carryover. Provided overt non-verbal and written cues to aid awareness of dysnomia, which was successful. Pt able to correct dysnomia with usual fading to rare min A today. Intermittently able to describe targeted words with guiding questions. Provided open-ended questions as part of HEP.   04/15/23: Targeted  divergent naming of personally relevant category with occasional min A he named and wrote 15 car parts. Improved from 1st session. Targeted verbal compensations for aphasia using semantic feature analysis (SFA) - due to difficulty with words, modified task to use of photo cards with known category (animals) [name] required frequent mod questioning cues, visual cues (SFA chart) and written cues to generate 3 salient descriptions for 7 animals - extended time consistently  04/10/23: During informal conversation, pt uses frequent vague speech and short responses (1-2 words) to answer SLP guiding questions. Addressed word finding and sentence generation with Scientist, product/process development (VNeST). For the verbs "eat" and "like", the patient generated 3 subject and objects for each verb, a total of 6 subjects and objects with usual mod-max questioning cues required. Pt generated 3 complex sentences for each verb by answering "wh" questions with occasional mod cues. Targeted response elaboration in picture description task, where pt generated complex sentences given occasional mod A. Pt exhibited occasional semantic paraphasias and perseveration, but was generally aware of errors and corrected with min-mod cues.   04/01/23: To target word finding, sentence generation,   Scientist, product/process development (VNeST) was utilized. The pt generated 3 subjects and objects for 4 verbs, (drive, throw, measure, deliver) for a total of 12 subject objects. Pt required occasional min to  mod semantic and questioning  cues. Pt generated 4 complex sentences by answering "wh" questions. Pt required occasional min semantic cues to generate complex sentences. Targeted divergent naming of car parts - Patsy named 10 car parts with usual mod semantic cues.    03/27/23 (eval day): reviewed goals, provided homework   PATIENT EDUCATION: Education details: See Today's Treatment, See patient instructions; aphasia compensations and  education Person educated: Patient and Spouse Education method: Explanation, Demonstration, and Verbal cues Education comprehension: needs further education   GOALS: Goals reviewed with patient? Yes  SHORT TERM GOALS: Target date: 05/01/23  Pt will  name 10 items in personally relevant category with occasional min A Baseline: Goal status: PARTIALLY MET  2.  Pt will employ verbal compensations for word finding in structured naming task with occasional min A Baseline:  Goal status: MET  3.  Pt will generate moderately complex sentences in structured tasks such as Film/video editor with occasional min A Baseline:  Goal status: NOT MET  4.  Pt will use compensations for aphasia to place order at unfamiliar restaurant with occasional min A Baseline:  Goal status: NOT ADDRESSED   5.  Pt will type/write simple 3-5 word sentence or response to train for texting  with occasional min A Baseline:  Goal status: PARTIALLY MET   LONG TERM GOALS: Target date: 06/19/23  Pt will complete complex naming task with 90% accuracy and rare min A Baseline:  Goal status: IN PROGRESS  2.  Pt will use compensations for aphasia in conversation as needed 3/5 opportunities with occasional min A Baseline:  Goal status: IN PROGRESS  3.  Pt will use compensations for aphasia to place a simple business call and to order at 3 restaurants with rare min A from spouse. Baseline:  Goal status: IN PROGRESS  4.  Pt will respond to 3 texts with simple 3-5 word message or emoji Baseline:  Goal status: IN PROGRESS  5.  Pt will improve score on Communication Participation Item Bank by 3 points Baseline: 9 Goal status: IN PROGRESS   ASSESSMENT:  CLINICAL IMPRESSION: Patient is a 58 y.o. 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 He demonstrates improvements in communication since initiating ST, including  ability to communicate more effectively in conversation. Targeted writing and scripting this session. Pt will continue to benefit from skilled ST to maximize communication for safety, independence and QOL.  OBJECTIVE IMPAIRMENTS: include aphasia. These impairments are limiting patient from return to work, Clinical cytogeneticist finances, household responsibilities, ADLs/IADLs, and effectively communicating at home and in community. Factors affecting potential to achieve goals and functional outcome are severity of impairments. Patient will benefit from skilled SLP services to address above impairments and improve overall function.  REHAB POTENTIAL: Good  PLAN:  SLP FREQUENCY: 2x/week  SLP DURATION: 12 weeks  PLANNED INTERVENTIONS: 92507 Treatment of speech (30 or 45 min) , Language facilitation, and Environmental controls    Dorrian Doggett, Dareen Ebbing, CCC-SLP 05/22/2023, 2:07 PM

## 2023-05-29 ENCOUNTER — Ambulatory Visit: Payer: No Typology Code available for payment source

## 2023-05-29 NOTE — Therapy (Deleted)
OUTPATIENT SPEECH LANGUAGE PATHOLOGY APHASIA TREATMENT   Patient Name: Gerald Robles MRN: [id number] DOB:07/02/1965, 58 y.o., male Today's Date: 05/29/2023  PCP: Marden Noble MD REFERRING PROVIDER: Marlowe Shores, MD  END OF SESSION:         Past Medical History:  Diagnosis Date   A-fib [facility])    Anemia    Anemia    Arthritis    ED (erectile dysfunction)    Hypertension    Sarcoidosis 2003   Past Surgical History:  Procedure Laterality Date   CARDIOVERSION N/A 11/08/2020   Procedure: CARDIOVERSION;  Surgeon: Sande Rives, MD;  Location: [facility] ENDOSCOPY;  Service: Cardiovascular;  Laterality: N/A;   KNEE ARTHROPLASTY Left 08/09/2016   Procedure: LEFT TOTAL KNEE ARTHROPLASTY WITH COMPUTER NAVIGATION;  Surgeon: Samson Frederic, MD;  Location: [facility] ORS;  Service: Orthopedics;  Laterality: Left;  Needs RNFA   KNEE ARTHROPLASTY Right 12/13/2016   Procedure: RIGHT TOTAL KNEE ARTHROPLASTY WITH COMPUTER NAVIGATION;  Surgeon: Samson Frederic, MD;  Location: [facility] ORS;  Service: Orthopedics;  Laterality: Right;  Needs RNFA   KNEE SURGERY Bilateral 1990   right acl with arthsroscopy; left kee artshroscopy    Right total knee     12/13/16 Dr. Linna Caprice   SHOULDER SURGERY Right    twice   VASECTOMY     Patient Active Problem List   Diagnosis Date Noted   Hyponatremia 11/06/2020   Hypertension    Nonischemic cardiomyopathy (HCC)    Post-traumatic osteoarthritis of right knee 12/13/2016   Osteoarthritis of left knee 08/09/2016   Paroxysmal atrial fibrillation (HCC) 05/04/2015    ONSET DATE: 03/18/2023 (referral date)  REFERRING DIAG:  Diagnosis  I63.9 (ICD-10-CM) - Cerebral infarction, unspecified   THERAPY DIAG: No diagnosis found.  Rationale for Evaluation and Treatment: Rehabilitation  SUBJECTIVE:   SUBJECTIVE STATEMENT: "When we talk at home there is no problem"  Pt accompanied by: significant other  PERTINENT HISTORY: The patient was admitted to [facility]  3/27-4/16/2024, initially presenting with dyspnea and hypoxia due to pneumonia, with hospitalization complicated by 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 His home rivaroxaban had been held the day prior due to a planned bronchoscopy. Follow-up MRI brain from 3/30, images personally reviewed, showed multiple acute infarcts in multiple vascular territories. He received inpatient rehab, then [facility] ST. He has not received ST for about 2 months per s/o.   PAIN: Are you having pain? No   TODAY'S TREATMENT:                                                                                                                                         05/29/23:  05/22/23: Targeted word finding and verbal compensations for aphasia generating 3 similarities and differences of personally relevant categories (metal rock/country; sedan/pick up; camper/RV) With usual min to mod questioning and semantic cues, [name]  generated 3-4 similarities and differences for each pair. 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 Conversation re: extended family [place]ctis named 6 members of extended family with supervision cues. 1 verbal cue from [place] to elicit specific example rather than empty language.    05/20/23:  He has not made phone calls yet. Targeted complex naming and written expression generating 1 item in lower frequency category with a given letter [name] required usual mod semantic cues for naming 4/5. For writing, he continues to benefit from spelling aloud 5/5 words. Targeted written expression at word level for personally relevant words (deli items, rock bands) - he required usual mod semantic cues to generate words, occasional min cues to ID and self correct written errors. In conversation, [place] required questioning cues to generate specific rather than vague empty words.   05/15/23: Completed some writing at home.  Wife observed improved accuracy when cued to spell aloud. Targeted writing personally relevant words and phrases with rare mod A required. Improved since last session. Targeted sentence generation given specific word. Pt required occasional modeling and usual min cues to generate appropriate sentence. Demonstrating improved frustration tolerance and use of self-advocacy (ex: "give me a minute"). Provided positive reinforcement of strategy use. Generated individualized conversational questions to encourage phone conversations with friends. Updated HEP to call friends x2 before next session with use of scripts.    05/06/23: Reported increased communication effectiveness at home. Pt self-advocating for extra processing time with success. Targeted writing and typing as pt currently deferring texting to his wife. Trialed texting common word with max A required. Wife questioned visual component. Able to type targeted word, with verbal coaching required to ID letters. Scaffolded task to written words, with occasional mod A fading to rare mod A required for writing pertinent words. Demonstrated good awareness of errors with occasional cues required to correct. Suggested focusing on writing/typing as part of HEP as this is important to patient.   04/24/23: Discussed specific Christmas gifts for his sons, with pt requiring occasional mod A for description of gift x1. Wife able to provide appropriate cues with rare min A. Further targeted use of description strategy for personally relevant words and common objects. Occasional to usual verbal prompting and visual cues required to aid specificity and thoroughness of descriptions. Endorsed difficulty completing task but agreeable to completion with limited frustration exhibited this session. Provided guiding framework and words to practice describing at home. Suggested both pt and wife provided descriptions for modeling and demonstration to aid pt accuracy.   04/22/23:  Keval continues to have limited conversations with family only. Encouraged him to expand communication partners to friends. Targeted sentence generation, thought organization and participation in conversation with conversation starter cards - in this less structured language task, Olis required frequent questioning cues to clarify vague, empty words. With extended time, and questioning cues he generated 2-3 details for each of the 6 questions. Frustration did not occur today. Reviewed strategies to manage frustration when communication break downs occur.   04/17/23: Endorsed some improvements in speech since initiating ST. Dicussed communication challenges at home, including intermittent frustration with anomia impeding discourse. Recommended using 5 intentional breaths to reduce frustration and caregivers using cueing hierarchy to aid anomia. Pt able to demonstrate intentional breathing to reduce frustration during structured tasks with mod I. Wife able to provide appropriate cues with rare prompting. Targeted response generation to close-ended conversational questions. Occasional repetitions required to aid auditory comprehension. Trialed pt  repeating question prior to answering without successful carryover. Provided overt non-verbal and written cues to aid awareness of dysnomia, which was successful. Pt able to correct dysnomia with usual fading to rare min A today. Intermittently able to describe targeted words with guiding questions. Provided open-ended questions as part of HEP.   04/15/23: Targeted divergent naming of personally relevant category with occasional min A he named and wrote 15 car parts. Improved from 1st session. Targeted verbal compensations for aphasia using semantic feature analysis (SFA) - due to difficulty with words, modified task to use of photo cards with known category (animals) [name] required frequent mod questioning cues, visual cues (SFA chart) and written cues to generate 3  salient descriptions for 7 animals - extended time consistently  04/10/23: During informal conversation, pt uses frequent vague speech and short responses (1-2 words) to answer SLP guiding questions. Addressed word finding and sentence generation with Scientist, product/process development (VNeST). For the verbs "eat" and "like", the patient generated 3 subject and objects for each verb, a total of 6 subjects and objects with usual mod-max questioning cues required. Pt generated 3 complex sentences for each verb by answering "wh" questions with occasional mod cues. Targeted response elaboration in picture description task, where pt generated complex sentences given occasional mod A. Pt exhibited occasional semantic paraphasias and perseveration, but was generally aware of errors and corrected with min-mod cues.   04/01/23: To target word finding, sentence generation,   Scientist, product/process development (VNeST) was utilized. The pt generated 3 subjects and objects for 4 verbs, (drive, throw, measure, deliver) for a total of 12 subject objects. Pt required occasional min to  mod semantic and questioning  cues. Pt generated 4 complex sentences by answering "wh" questions. Pt required occasional min semantic cues to generate complex sentences. Targeted divergent naming of car parts - Dameian named 10 car parts with usual mod semantic cues.    03/27/23 (eval day): reviewed goals, provided homework   PATIENT EDUCATION: Education details: See Today's Treatment, See patient instructions; aphasia compensations and education Person educated: Patient and Spouse Education method: Explanation, Demonstration, and Verbal cues Education comprehension: needs further education   GOALS: Goals reviewed with patient? Yes  SHORT TERM GOALS: Target date: 05/01/23  Pt will  name 10 items in personally relevant category with occasional min A Baseline: Goal status: PARTIALLY MET  2.  Pt will employ verbal compensations  for word finding in structured naming task with occasional min A Baseline:  Goal status: MET  3.  Pt will generate moderately complex sentences in structured tasks such as Film/video editor with occasional min A Baseline:  Goal status: NOT MET  4.  Pt will use compensations for aphasia to place order at unfamiliar restaurant with occasional min A Baseline:  Goal status: NOT ADDRESSED   5.  Pt will type/write simple 3-5 word sentence or response to train for texting  with occasional min A Baseline:  Goal status: PARTIALLY MET   LONG TERM GOALS: Target date: 06/19/23  Pt will complete complex naming task with 90% accuracy and rare min A Baseline:  Goal status: IN PROGRESS  2.  Pt will use compensations for aphasia in conversation as needed 3/5 opportunities with occasional min A Baseline:  Goal status: IN PROGRESS  3.  Pt will use compensations for aphasia to place a simple business call and to order at 3 restaurants with rare min A from spouse. Baseline:  Goal status: IN PROGRESS  4.  Pt will respond to 3 texts with simple 3-5 word message or emoji Baseline:  Goal status: IN PROGRESS  5.  Pt will improve score on Communication Participation Item Bank by 3 points Baseline: 9 Goal status: IN PROGRESS   ASSESSMENT:  CLINICAL IMPRESSION: Patient is a 58 y.o. 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 He demonstrates improvements in communication since initiating ST, including ability to communicate more effectively in conversation. Targeted writing and scripting this session. Pt will continue to benefit from skilled ST to maximize communication for safety, independence and QOL.  OBJECTIVE IMPAIRMENTS: include aphasia. These impairments are limiting patient from return to work, Clinical cytogeneticist finances, household responsibilities, ADLs/IADLs, and effectively communicating at home and in  community. Factors affecting potential to achieve goals and functional outcome are severity of impairments. Patient will benefit from skilled SLP services to address above impairments and improve overall function.  REHAB POTENTIAL: Good  PLAN:  SLP FREQUENCY: 2x/week  SLP DURATION: 12 weeks  PLANNED INTERVENTIONS: [id number] Treatment of speech (30 or 45 min) , Language facilitation, and Environmental controls    Gracy Racer, CCC-SLP 05/29/2023, 7:55 AM

## 2023-06-03 ENCOUNTER — Ambulatory Visit: Payer: No Typology Code available for payment source

## 2023-06-12 ENCOUNTER — Encounter: Payer: Self-pay | Admitting: Speech Pathology

## 2023-06-12 ENCOUNTER — Ambulatory Visit: Payer: No Typology Code available for payment source | Attending: Internal Medicine | Admitting: Speech Pathology

## 2023-06-12 DIAGNOSIS — R4701 Aphasia: Secondary | ICD-10-CM

## 2023-06-12 NOTE — Therapy (Signed)
 OUTPATIENT SPEECH LANGUAGE PATHOLOGY APHASIA TREATMENT   Patient Name: Gerald Robles MRN: [id number] DOB:1965-11-30, 58 y.o., male Today's Date: 06/12/2023  PCP: Delice Charleston MD REFERRING PROVIDER: Darren Monetta RAMAN, MD  END OF SESSION:  End of Session - 06/12/23 1407     Visit Number 12    Number of Visits 25    Authorization Type [place] - 15 visits    Authorization Time Period 03/01/23-08/28/23    Authorization - Visit Number 12    Authorization - Number of Visits 15    SLP Start Time 1400    SLP Stop Time  1445    SLP Time Calculation (min) 45 min    Activity Tolerance Patient tolerated treatment well                   Past Medical History:  Diagnosis Date   A-fib (HCC)    Anemia    Anemia    Arthritis    ED (erectile dysfunction)    Hypertension    Sarcoidosis 2003   Past Surgical History:  Procedure Laterality Date   CARDIOVERSION N/A 11/08/2020   Procedure: CARDIOVERSION;  Surgeon: Barbaraann Darryle Ned, MD;  Location: [facility];  Service: Cardiovascular;  Laterality: N/A;   KNEE ARTHROPLASTY Left 08/09/2016   Procedure: LEFT TOTAL KNEE ARTHROPLASTY WITH COMPUTER NAVIGATION;  Surgeon: Redell Shoals, MD;  Location: [facility];  Service: Orthopedics;  Laterality: Left;  Needs RNFA   KNEE ARTHROPLASTY Right 12/13/2016   Procedure: RIGHT TOTAL KNEE ARTHROPLASTY WITH COMPUTER NAVIGATION;  Surgeon: Shoals Redell, MD;  Location: [facility];  Service: Orthopedics;  Laterality: Right;  Needs RNFA   KNEE SURGERY Bilateral 1990   right acl with arthsroscopy; left kee artshroscopy    Right total knee     12/13/16 Dr. Shoals   SHOULDER SURGERY Right    twice   VASECTOMY     Patient Active Problem List   Diagnosis Date Noted   Hyponatremia 11/06/2020   Hypertension    Nonischemic cardiomyopathy (HCC)    Post-traumatic osteoarthritis of right knee 12/13/2016   Osteoarthritis of left knee 08/09/2016   Paroxysmal atrial fibrillation (HCC) 05/04/2015    ONSET DATE:  03/18/2023 (referral date)  REFERRING DIAG:  Diagnosis  I63.9 (ICD-10-CM) - Cerebral infarction, unspecified   THERAPY DIAG: Aphasia  Rationale for Evaluation and Treatment: Rehabilitation  SUBJECTIVE:   SUBJECTIVE STATEMENT: Gerald Robles returns after being out several weeks due to the death of his mother  Pt accompanied by: significant other  PERTINENT HISTORY: The patient was admitted to [facility] 3/27-4/16/2024, initially presenting with dyspnea and hypoxia due to pneumonia, with hospitalization complicated by 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 His home rivaroxaban  had been held the day prior due to a planned bronchoscopy. Follow-up MRI brain from 3/30, images personally reviewed, showed multiple acute infarcts in multiple vascular territories. He received inpatient rehab, then [facility]. He has not received ST for about 2 months per s/o.   PAIN: Are you having pain? No   TODAY'S TREATMENT:  06/12/23: Gerald Robles returns after his mom's death. He reports success communicating with funeral home, extended family and friends at the funeral. He has started to text his spouse and son. Homework is to send 2 voice texts to someone he has not texted yet. Complex naming task with given 1st letter completed 90% accuracy and occasional semantic cues. Complex naming in increasing order (ex: 2 items in between a cup and swimming pool) Gerald Robles required occasional min A to Id relationship of the words and rare min A to name 2 items in between the words. Gerald Robles used verbal compensations as needed in structured tasks as well as in conversation as needed with success 3/4 opportunities.  05/22/23: Targeted word finding and verbal compensations for aphasia generating 3 similarities and differences of personally relevant categories  (metal rock/country; sedan/pick up; camper/RV) With usual min to mod questioning and semantic cues, Gerald Robles generated 3-4 similarities and differences for each pair. 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 Conversation re: extended family Gerald Robles named 6 members of extended family with supervision cues. 1 verbal cue from [place] to elicit specific example rather than empty language.    05/20/23:  He has not made phone calls yet. Targeted complex naming and written expression generating 1 item in lower frequency category with a given letter Gerald Robles Gerald Robles required usual mod semantic cues for naming 4/5. For writing, he continues to benefit from spelling aloud 5/5 words. Targeted written expression at word level for personally relevant words (deli items, rock bands) - he required usual mod semantic cues to generate words, occasional min cues to ID and self correct written errors. In conversation, Gerald Robles required questioning cues to generate specific rather than vague empty words.   05/15/23: Completed some writing at home. Wife observed improved accuracy when cued to spell aloud. Targeted writing personally relevant words and phrases with rare mod A required. Improved since last session. Targeted sentence generation given specific word. Pt required occasional modeling and usual min cues to generate appropriate sentence. Demonstrating improved frustration tolerance and use of self-advocacy (ex: give me a minute). Provided positive reinforcement of strategy use. Generated individualized conversational questions to encourage phone conversations with friends. Updated HEP to call friends x2 before next session with use of scripts.    05/06/23: Reported increased communication effectiveness at home. Pt self-advocating for extra processing time with success. Targeted writing and typing as pt currently deferring texting to his wife. Trialed texting common word with max A required.  Wife questioned visual component. Able to type targeted word, with verbal coaching required to ID letters. Scaffolded task to written words, with occasional mod A fading to rare mod A required for writing pertinent words. Demonstrated good awareness of errors with occasional cues required to correct. Suggested focusing on writing/typing as part of HEP as this is important to patient.   04/24/23: Discussed specific Christmas gifts for his sons, with pt requiring occasional mod A for description of gift x1. Wife able to provide appropriate cues with rare min A. Further targeted use of description strategy for personally relevant words and common objects. Occasional to usual verbal prompting and visual cues required to aid specificity and thoroughness of descriptions. Endorsed difficulty completing task but agreeable to completion with limited frustration exhibited this session. Provided guiding framework and words to practice describing at home. Suggested both pt and wife provided descriptions for modeling and demonstration to aid pt accuracy.   04/22/23: Gerald Robles continues to have limited conversations with  family only. Encouraged him to expand communication partners to friends. Targeted sentence generation, thought organization and participation in conversation with conversation starter cards - in this less structured language task, Gerald Robles required frequent questioning cues to clarify vague, empty words. With extended time, and questioning cues he generated 2-3 details for each of the 6 questions. Frustration did not occur today. Reviewed strategies to manage frustration when communication break downs occur.   04/17/23: Endorsed some improvements in speech since initiating ST. Dicussed communication challenges at home, including intermittent frustration with anomia impeding discourse. Recommended using 5 intentional breaths to reduce frustration and caregivers using cueing hierarchy to aid anomia. Pt able to  demonstrate intentional breathing to reduce frustration during structured tasks with mod I. Wife able to provide appropriate cues with rare prompting. Targeted response generation to close-ended conversational questions. Occasional repetitions required to aid auditory comprehension. Trialed pt repeating question prior to answering without successful carryover. Provided overt non-verbal and written cues to aid awareness of dysnomia, which was successful. Pt able to correct dysnomia with usual fading to rare min A today. Intermittently able to describe targeted words with guiding questions. Provided open-ended questions as part of HEP.   04/15/23: Targeted divergent naming of personally relevant category with occasional min A he named and wrote 15 car parts. Improved from 1st session. Targeted verbal compensations for aphasia using semantic feature analysis (SFA) - due to difficulty with words, modified task to use of photo cards with known category (animals) Gerald Robles Gerald Robles required frequent mod questioning cues, visual cues (SFA chart) and written cues to generate 3 salient descriptions for 7 animals - extended time consistently  04/10/23: During informal conversation, pt uses frequent vague speech and short responses (1-2 words) to answer SLP guiding questions. Addressed word finding and sentence generation with Scientist, Product/process Development (VNeST). For the verbs eat and like, the patient generated 3 subject and objects for each verb, a total of 6 subjects and objects with usual mod-max questioning cues required. Pt generated 3 complex sentences for each verb by answering wh questions with occasional mod cues. Targeted response elaboration in picture description task, where pt generated complex sentences given occasional mod A. Pt exhibited occasional semantic paraphasias and perseveration, but was generally aware of errors and corrected with min-mod cues.   04/01/23: To target word finding, sentence  generation,   Scientist, Product/process Development (VNeST) was utilized. The pt generated 3 subjects and objects for 4 verbs, (drive, throw, measure, deliver) for a total of 12 subject objects. Pt required occasional min to  mod semantic and questioning  cues. Pt generated 4 complex sentences by answering wh questions. Pt required occasional min semantic cues to generate complex sentences. Targeted divergent naming of car parts - Gerald Robles named 10 car parts with usual mod semantic cues.    03/27/23 (eval day): reviewed goals, provided homework   PATIENT EDUCATION: Education details: See Today's Treatment, See patient instructions; aphasia compensations and education Person educated: Patient and Spouse Education method: Explanation, Demonstration, and Verbal cues Education comprehension: needs further education   GOALS: Goals reviewed with patient? Yes  SHORT TERM GOALS: Target date: 05/01/23  Pt will  name 10 items in personally relevant category with occasional min A Baseline: Goal status: PARTIALLY MET  2.  Pt will employ verbal compensations for word finding in structured naming task with occasional min A Baseline:  Goal status: MET  3.  Pt will generate moderately complex sentences in structured tasks such as Film/video Editor with  occasional min A Baseline:  Goal status: NOT MET  4.  Pt will use compensations for aphasia to place order at unfamiliar restaurant with occasional min A Baseline:  Goal status: NOT ADDRESSED   5.  Pt will type/write simple 3-5 word sentence or response to train for texting  with occasional min A Baseline:  Goal status: PARTIALLY MET   LONG TERM GOALS: Target date: 06/19/23  Pt will complete complex naming task with 90% accuracy and rare min A Baseline:  Goal status: IN PROGRESS  2.  Pt will use compensations for aphasia in conversation as needed 3/5 opportunities with occasional min A Baseline:  Goal status: IN PROGRESS  3.   Pt will use compensations for aphasia to place a simple business call and to order at 3 restaurants with rare min A from spouse. Baseline:  Goal status: MET  4.  Pt will respond to 3 texts with simple 3-5 word message or emoji Baseline:  Goal status: IN PROGRESS  5.  Pt will improve score on Communication Participation Item Bank by 3 points Baseline: 9 Goal status: IN PROGRESS   ASSESSMENT:  CLINICAL IMPRESSION: Patient is a 58 y.o. 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 He demonstrates improvements in communication since initiating ST, including ability to communicate more effectively in conversation. Targeted writing and scripting this session. Pt will continue to benefit from skilled ST to maximize communication for safety, independence and QOL.  OBJECTIVE IMPAIRMENTS: include aphasia. These impairments are limiting patient from return to work, clinical cytogeneticist finances, household responsibilities, ADLs/IADLs, and effectively communicating at home and in community. Factors affecting potential to achieve goals and functional outcome are severity of impairments. Patient will benefit from skilled SLP services to address above impairments and improve overall function.  REHAB POTENTIAL: Good  PLAN:  SLP FREQUENCY: 2x/week  SLP DURATION: 12 weeks  PLANNED INTERVENTIONS: 92507 Treatment of speech (30 or 45 min) , Language facilitation, and Environmental controls    Enis Leatherwood, Leita Caldron, CCC-SLP 06/12/2023, 3:01 PM

## 2023-06-19 ENCOUNTER — Ambulatory Visit: Payer: No Typology Code available for payment source

## 2023-06-19 DIAGNOSIS — R4701 Aphasia: Secondary | ICD-10-CM

## 2023-06-19 NOTE — Therapy (Signed)
OUTPATIENT SPEECH LANGUAGE PATHOLOGY APHASIA TREATMENT (RECERT)   Patient Name: Gerald Robles MRN: [id number] DOB:[birth year], 58 y.o., male Today's Date: 06/19/2023  PCP: Marden Noble MD REFERRING PROVIDER: Marlowe Shores, MD  END OF SESSION:  End of Session - 06/19/23 1419     Visit Number 13    Number of Visits 25    Date for SLP Re-Evaluation 07/03/23    Authorization Type [facility] - 15 visits    Authorization Time Period 03/01/23-08/28/23    Authorization - Visit Number 12    Authorization - Number of Visits 15    SLP Start Time 1400    SLP Stop Time  1445    SLP Time Calculation (min) 45 min    Activity Tolerance Patient tolerated treatment well              Past Medical History:  Diagnosis Date   A-fib (HCC)    Anemia    Anemia    Arthritis    ED (erectile dysfunction)    Hypertension    Sarcoidosis 2003   Past Surgical History:  Procedure Laterality Date   CARDIOVERSION N/A 11/08/2020   Procedure: CARDIOVERSION;  Surgeon: Sande Rives, MD;  Location: [facility] ENDOSCOPY;  Service: Cardiovascular;  Laterality: N/A;   KNEE ARTHROPLASTY Left 08/09/2016   Procedure: LEFT TOTAL KNEE ARTHROPLASTY WITH COMPUTER NAVIGATION;  Surgeon: Samson Frederic, MD;  Location: [facility] ORS;  Service: Orthopedics;  Laterality: Left;  Needs RNFA   KNEE ARTHROPLASTY Right 12/13/2016   Procedure: RIGHT TOTAL KNEE ARTHROPLASTY WITH COMPUTER NAVIGATION;  Surgeon: Samson Frederic, MD;  Location: [facility] ORS;  Service: Orthopedics;  Laterality: Right;  Needs RNFA   KNEE SURGERY Bilateral 1990   right acl with arthsroscopy; left kee artshroscopy    Right total knee     12/13/16 Dr. Linna Caprice   SHOULDER SURGERY Right    twice   VASECTOMY     Patient Active Problem List   Diagnosis Date Noted   Hyponatremia 11/06/2020   Hypertension    Nonischemic cardiomyopathy (HCC)    Post-traumatic osteoarthritis of right knee 12/13/2016   Osteoarthritis of left knee 08/09/2016   Paroxysmal atrial  fibrillation (HCC) 05/04/2015    ONSET DATE: 03/18/2023 (referral date)  REFERRING DIAG:  Diagnosis  I63.9 (ICD-10-CM) - Cerebral infarction, unspecified   THERAPY DIAG: Aphasia  Rationale for Evaluation and Treatment: Rehabilitation  SUBJECTIVE:   SUBJECTIVE STATEMENT: Stirling shared still grieving, processing recent events, shared some moments of successful communication/conversation recently. Pt accompanied by: significant other  PERTINENT HISTORY: The patient was admitted to [facility] 3/27-4/16/2024, initially presenting with dyspnea and hypoxia due to pneumonia, with hospitalization complicated by 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 His home rivaroxaban had been held the day prior due to a planned bronchoscopy. Follow-up MRI brain from 3/30, images personally reviewed, showed multiple acute infarcts in multiple vascular territories. He received inpatient rehab, then [facility] ST. He has not received ST for about 2 months per s/o.   PAIN: Are you having pain? No   TODAY'S TREATMENT:  06/19/23: Pt reported difficulty with texting and ST targeted practice with talk to text to target improved communication and strengthen functional language skills/strategies. Pt demonstrated practice with talk to text successfully given mod fading to min-A, but reportedly dislikes texting although sometimes necessary. ST targeting language deficits and word finding through conversation regarding recent events in pt's life and future plans. ST provided intermittent questioning cues and occasional verbal cues from [place] aided moments of anomia. Pt shared upcoming trips and potential plans to purchase and embark on new project updating [address] Pt produced occasional semantic paraphasia's, ex. [place] for [place], but  corrected error'd productions with questioning cues.   06/12/23: [name] returns after his mom's death. He reports success communicating with funeral home, extended family and friends at the funeral. He has started to text his spouse and son. Homework is to send 2 voice texts to someone he has not texted yet. Complex naming task with given 1st letter completed 90% accuracy and occasional semantic cues. Complex naming in increasing order (ex: 2 items in between a cup and swimming pool) [name] required occasional min A to Id relationship of the words and rare min A to name 2 items in between the words. Taishaun used verbal compensations as needed in structured tasks as well as in conversation as needed with success 3/4 opportunities.  05/22/23: Targeted word finding and verbal compensations for aphasia generating 3 similarities and differences of personally relevant categories (metal rock/country; sedan/pick up; camper/RV) With usual min to mod questioning and semantic cues, [name] generated 3-4 similarities and differences for each pair. 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 Conversation re: extended family Hasaan named 6 members of extended family with supervision cues. 1 verbal cue from Alex to elicit specific example rather than empty language.    05/20/23:  He has not made phone calls yet. Targeted complex naming and written expression generating 1 item in lower frequency category with a given letter [name] required usual mod semantic cues for naming 4/5. For writing, he continues to benefit from spelling aloud 5/5 words. Targeted written expression at word level for personally relevant words (deli items, rock bands) - he required usual mod semantic cues to generate words, occasional min cues to ID and self correct written errors. In conversation, [name] required questioning cues to generate specific rather than vague empty words.   05/15/23: Completed some  writing at home. Wife observed improved accuracy when cued to spell aloud. Targeted writing personally relevant words and phrases with rare mod A required. Improved since last session. Targeted sentence generation given specific word. Pt required occasional modeling and usual min cues to generate appropriate sentence. Demonstrating improved frustration tolerance and use of self-advocacy (ex: "give me a minute"). Provided positive reinforcement of strategy use. Generated individualized conversational questions to encourage phone conversations with friends. Updated HEP to call friends x2 before next session with use of scripts.    05/06/23: Reported increased communication effectiveness at home. Pt self-advocating for extra processing time with success. Targeted writing and typing as pt currently deferring texting to his wife. Trialed texting common word with max A required. Wife questioned visual component. Able to type targeted word, with verbal coaching required to ID letters. Scaffolded task to written words, with occasional mod A fading to rare mod A required for writing pertinent words. Demonstrated good awareness of errors with occasional cues required to correct. Suggested focusing on writing/typing as part of HEP as this is important  to patient.   04/24/23: Discussed specific Christmas gifts for his sons, with pt requiring occasional mod A for description of gift x1. Wife able to provide appropriate cues with rare min A. Further targeted use of description strategy for personally relevant words and common objects. Occasional to usual verbal prompting and visual cues required to aid specificity and thoroughness of descriptions. Endorsed difficulty completing task but agreeable to completion with limited frustration exhibited this session. Provided guiding framework and words to practice describing at home. Suggested both pt and wife provided descriptions for modeling and demonstration to aid pt accuracy.    04/22/23: Kedarius continues to have limited conversations with family only. Encouraged him to expand communication partners to friends. Targeted sentence generation, thought organization and participation in conversation with conversation starter cards - in this less structured language task, Hussain required frequent questioning cues to clarify vague, empty words. With extended time, and questioning cues he generated 2-3 details for each of the 6 questions. Frustration did not occur today. Reviewed strategies to manage frustration when communication break downs occur.   04/17/23: Endorsed some improvements in speech since initiating ST. Dicussed communication challenges at home, including intermittent frustration with anomia impeding discourse. Recommended using 5 intentional breaths to reduce frustration and caregivers using cueing hierarchy to aid anomia. Pt able to demonstrate intentional breathing to reduce frustration during structured tasks with mod I. Wife able to provide appropriate cues with rare prompting. Targeted response generation to close-ended conversational questions. Occasional repetitions required to aid auditory comprehension. Trialed pt repeating question prior to answering without successful carryover. Provided overt non-verbal and written cues to aid awareness of dysnomia, which was successful. Pt able to correct dysnomia with usual fading to rare min A today. Intermittently able to describe targeted words with guiding questions. Provided open-ended questions as part of HEP.   04/15/23: Targeted divergent naming of personally relevant category with occasional min A he named and wrote 15 car parts. Improved from 1st session. Targeted verbal compensations for aphasia using semantic feature analysis (SFA) - due to difficulty with words, modified task to use of photo cards with known category (animals) [name] required frequent mod questioning cues, visual cues (SFA chart) and written cues to  generate 3 salient descriptions for 7 animals - extended time consistently  04/10/23: During informal conversation, pt uses frequent vague speech and short responses (1-2 words) to answer SLP guiding questions. Addressed word finding and sentence generation with Scientist, product/process development (VNeST). For the verbs "eat" and "like", the patient generated 3 subject and objects for each verb, a total of 6 subjects and objects with usual mod-max questioning cues required. Pt generated 3 complex sentences for each verb by answering "wh" questions with occasional mod cues. Targeted response elaboration in picture description task, where pt generated complex sentences given occasional mod A. Pt exhibited occasional semantic paraphasias and perseveration, but was generally aware of errors and corrected with min-mod cues.   04/01/23: To target word finding, sentence generation,   Scientist, product/process development (VNeST) was utilized. The pt generated 3 subjects and objects for 4 verbs, (drive, throw, measure, deliver) for a total of 12 subject objects. Pt required occasional min to  mod semantic and questioning  cues. Pt generated 4 complex sentences by answering "wh" questions. Pt required occasional min semantic cues to generate complex sentences. Targeted divergent naming of car parts - Aarin named 10 car parts with usual mod semantic cues.    03/27/23 (eval day): reviewed goals, provided homework  PATIENT EDUCATION: Education details: See Today's Treatment, See patient instructions; aphasia compensations and education Person educated: Patient and Spouse Education method: Explanation, Demonstration, and Verbal cues Education comprehension: needs further education   GOALS: Goals reviewed with patient? Yes  SHORT TERM GOALS: Target date: 05/01/23  Pt will  name 10 items in personally relevant category with occasional min A Baseline: Goal status: PARTIALLY MET  2.  Pt will employ verbal  compensations for word finding in structured naming task with occasional min A Baseline:  Goal status: MET  3.  Pt will generate moderately complex sentences in structured tasks such as Film/video editor with occasional min A Baseline:  Goal status: NOT MET  4.  Pt will use compensations for aphasia to place order at unfamiliar restaurant with occasional min A Baseline:  Goal status: NOT ADDRESSED   5.  Pt will type/write simple 3-5 word sentence or response to train for texting  with occasional min A Baseline:  Goal status: PARTIALLY MET   LONG TERM GOALS: Target date: 06/19/23 (07/03/2023 for recert)  Pt will complete complex naming task with 90% accuracy and rare min A Baseline:  Goal status: IN PROGRESS  2.  Pt will use compensations for aphasia in conversation as needed 3/5 opportunities with occasional min A Baseline:  Goal status: MET  3.  Pt will use compensations for aphasia to place a simple business call and to order at 3 restaurants with rare min A from spouse. Baseline:  Goal status: MET  4.  Pt will respond to 3 texts with simple 3-5 word message or emoji Baseline:  Goal status: MET  5.  Pt will improve score on Communication Participation Item Bank by 3 points Baseline: 9 Goal status: IN PROGRESS   ASSESSMENT:  CLINICAL IMPRESSION: Patient is a 58 y.o. 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 He demonstrates improvements in communication since initiating ST, including ability to communicate more effectively in conversation. Targeted typing and discourse level today, with pt able to utilize compensations with occasional min-mod A based on complexity. Pt will continue to benefit from skilled ST to maximize communication for safety, independence and QOL.  OBJECTIVE IMPAIRMENTS: Include aphasia.These impairments are limiting patient from return to work, Clinical cytogeneticist  finances, household responsibilities, ADLs/IADLs, and effectively communicating at home and in community. Factors affecting potential to achieve goals and functional outcome are severity of impairments. Patient will benefit from skilled SLP services to address above impairments and improve overall function.  REHAB POTENTIAL: Good  PLAN:  SLP FREQUENCY: 2x/week  SLP DURATION: 12 weeks  PLANNED INTERVENTIONS: 92507 Treatment of speech (30 or 45 min) , Language facilitation, and Environmental controls    Gracy Racer, CCC-SLP 06/19/2023, 3:05 PM

## 2023-06-19 NOTE — Patient Instructions (Signed)
Open Messages  Tap on microphone icon Clearly verbalize response to the message (short and sweet - it will pick up on everything you say) Proof read the text (make sure its accurate!) Hit send

## 2023-06-26 ENCOUNTER — Ambulatory Visit: Payer: No Typology Code available for payment source

## 2023-06-26 NOTE — Therapy (Deleted)
 OUTPATIENT SPEECH LANGUAGE PATHOLOGY APHASIA TREATMENT (RECERT)   Patient Name: Gerald Robles MRN: [id number] DOB:March 15, 1966, 58 y.o., male Today's Date: 06/26/2023  PCP: Marden Noble MD REFERRING PROVIDER: Marlowe Shores, MD  END OF SESSION:     Past Medical History:  Diagnosis Date   A-fib [facility])    Anemia    Anemia    Arthritis    ED (erectile dysfunction)    Hypertension    Sarcoidosis 2003   Past Surgical History:  Procedure Laterality Date   CARDIOVERSION N/A 11/08/2020   Procedure: CARDIOVERSION;  Surgeon: Sande Rives, MD;  Location: [facility] ENDOSCOPY;  Service: Cardiovascular;  Laterality: N/A;   KNEE ARTHROPLASTY Left 08/09/2016   Procedure: LEFT TOTAL KNEE ARTHROPLASTY WITH COMPUTER NAVIGATION;  Surgeon: Samson Frederic, MD;  Location: [facility] ORS;  Service: Orthopedics;  Laterality: Left;  Needs RNFA   KNEE ARTHROPLASTY Right 12/13/2016   Procedure: RIGHT TOTAL KNEE ARTHROPLASTY WITH COMPUTER NAVIGATION;  Surgeon: Samson Frederic, MD;  Location: [facility] ORS;  Service: Orthopedics;  Laterality: Right;  Needs RNFA   KNEE SURGERY Bilateral 1990   right acl with arthsroscopy; left kee artshroscopy    Right total knee     12/13/16 Dr. Linna Caprice   SHOULDER SURGERY Right    twice   VASECTOMY     Patient Active Problem List   Diagnosis Date Noted   Hyponatremia 11/06/2020   Hypertension    Nonischemic cardiomyopathy (HCC)    Post-traumatic osteoarthritis of right knee 12/13/2016   Osteoarthritis of left knee 08/09/2016   Paroxysmal atrial fibrillation (HCC) 05/04/2015    ONSET DATE: 03/18/2023 (referral date)  REFERRING DIAG:  Diagnosis  I63.9 (ICD-10-CM) - Cerebral infarction, unspecified   THERAPY DIAG: No diagnosis found.  Rationale for Evaluation and Treatment: Rehabilitation  SUBJECTIVE:   SUBJECTIVE STATEMENT: [name] shared still grieving, processing recent events, shared some moments of successful communication/conversation recently. Pt accompanied  by: significant other  PERTINENT HISTORY: The patient was admitted to [facility] 3/27-4/16/2024, initially presenting with dyspnea and hypoxia due to pneumonia, with hospitalization complicated by 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 His home rivaroxaban had been held the day prior due to a planned bronchoscopy. Follow-up MRI brain from 3/30, images personally reviewed, showed multiple acute infarcts in multiple vascular territories. He received inpatient rehab, then [facility] ST. He has not received ST for about 2 months per s/o.   PAIN: Are you having pain? No   TODAY'S TREATMENT:                                                                                                                                          06/26/23: SLP initiated discussion regarding next steps given pt's reported success with ST. SLP facilitated brain storming ways in which pt can challenge himself to increase his responsibilities/meaningful interactions/communication opportunities on a regular  basis outside of ST.  06/19/23: Pt reported difficulty with texting and ST targeted practice with talk to text to target improved communication and strengthen functional language skills/strategies. Pt demonstrated practice with talk to text successfully given mod fading to min-A, but reportedly dislikes texting although sometimes necessary. ST targeting language deficits and word finding through conversation regarding recent events in pt's life and future plans. ST provided intermittent questioning cues and occasional verbal cues from [place] aided moments of anomia. Pt shared upcoming trips and potential plans to purchase and embark on new project updating [address]. Pt produced occasional semantic paraphasia's, ex. [place] for [place], but corrected error'd productions with questioning cues.   06/12/23: [name] returns after his mom's death. He reports success communicating with  funeral home, extended family and friends at the funeral. He has started to text his spouse and son. Homework is to send 2 voice texts to someone he has not texted yet. Complex naming task with given 1st letter completed 90% accuracy and occasional semantic cues. Complex naming in increasing order (ex: 2 items in between a cup and swimming pool) [name] required occasional min A to Id relationship of the words and rare min A to name 2 items in between the words. [name] used verbal compensations as needed in structured tasks as well as in conversation as needed with success 3/4 opportunities.  05/22/23: Targeted word finding and verbal compensations for aphasia generating 3 similarities and differences of personally relevant categories (metal rock/country; sedan/pick up; camper/RV) With usual min to mod questioning and semantic cues, [name] generated 3-4 similarities and differences for each pair. 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 Conversation re: extended family Bary named 6 members of extended family with supervision cues. 1 verbal cue from [place] to elicit specific example rather than empty language.    05/20/23:  He has not made phone calls yet. Targeted complex naming and written expression generating 1 item in lower frequency category with a given letter [name] required usual mod semantic cues for naming 4/5. For writing, he continues to benefit from spelling aloud 5/5 words. Targeted written expression at word level for personally relevant words (deli items, rock bands) - he required usual mod semantic cues to generate words, occasional min cues to ID and self correct written errors. In conversation, [name] required questioning cues to generate specific rather than vague empty words.   05/15/23: Completed some writing at home. Wife observed improved accuracy when cued to spell aloud. Targeted writing personally relevant words and phrases with rare mod A  required. Improved since last session. Targeted sentence generation given specific word. Pt required occasional modeling and usual min cues to generate appropriate sentence. Demonstrating improved frustration tolerance and use of self-advocacy (ex: "give me a minute"). Provided positive reinforcement of strategy use. Generated individualized conversational questions to encourage phone conversations with friends. Updated HEP to call friends x2 before next session with use of scripts.    05/06/23: Reported increased communication effectiveness at home. Pt self-advocating for extra processing time with success. Targeted writing and typing as pt currently deferring texting to his wife. Trialed texting common word with max A required. Wife questioned visual component. Able to type targeted word, with verbal coaching required to ID letters. Scaffolded task to written words, with occasional mod A fading to rare mod A required for writing pertinent words. Demonstrated good awareness of errors with occasional cues required to correct. Suggested focusing on writing/typing as part of  HEP as this is important to patient.   04/24/23: Discussed specific Christmas gifts for his sons, with pt requiring occasional mod A for description of gift x1. Wife able to provide appropriate cues with rare min A. Further targeted use of description strategy for personally relevant words and common objects. Occasional to usual verbal prompting and visual cues required to aid specificity and thoroughness of descriptions. Endorsed difficulty completing task but agreeable to completion with limited frustration exhibited this session. Provided guiding framework and words to practice describing at home. Suggested both pt and wife provided descriptions for modeling and demonstration to aid pt accuracy.   04/22/23: Calan continues to have limited conversations with family only. Encouraged him to expand communication partners to friends. Targeted  sentence generation, thought organization and participation in conversation with conversation starter cards - in this less structured language task, [place] required frequent questioning cues to clarify vague, empty words. With extended time, and questioning cues he generated 2-3 details for each of the 6 questions. Frustration did not occur today. Reviewed strategies to manage frustration when communication break downs occur.   04/17/23: Endorsed some improvements in speech since initiating ST. Dicussed communication challenges at home, including intermittent frustration with anomia impeding discourse. Recommended using 5 intentional breaths to reduce frustration and caregivers using cueing hierarchy to aid anomia. Pt able to demonstrate intentional breathing to reduce frustration during structured tasks with mod I. Wife able to provide appropriate cues with rare prompting. Targeted response generation to close-ended conversational questions. Occasional repetitions required to aid auditory comprehension. Trialed pt repeating question prior to answering without successful carryover. Provided overt non-verbal and written cues to aid awareness of dysnomia, which was successful. Pt able to correct dysnomia with usual fading to rare min A today. Intermittently able to describe targeted words with guiding questions. Provided open-ended questions as part of HEP.   04/15/23: Targeted divergent naming of personally relevant category with occasional min A he named and wrote 15 car parts. Improved from 1st session. Targeted verbal compensations for aphasia using semantic feature analysis (SFA) - due to difficulty with words, modified task to use of photo cards with known category (animals) [name] required frequent mod questioning cues, visual cues (SFA chart) and written cues to generate 3 salient descriptions for 7 animals - extended time consistently  04/10/23: During informal conversation, pt uses frequent vague speech  and short responses (1-2 words) to answer SLP guiding questions. Addressed word finding and sentence generation with Scientist, product/process development (VNeST). For the verbs "eat" and "like", the patient generated 3 subject and objects for each verb, a total of 6 subjects and objects with usual mod-max questioning cues required. Pt generated 3 complex sentences for each verb by answering "wh" questions with occasional mod cues. Targeted response elaboration in picture description task, where pt generated complex sentences given occasional mod A. Pt exhibited occasional semantic paraphasias and perseveration, but was generally aware of errors and corrected with min-mod cues.   04/01/23: To target word finding, sentence generation,   Scientist, product/process development (VNeST) was utilized. The pt generated 3 subjects and objects for 4 verbs, (drive, throw, measure, deliver) for a total of 12 subject objects. Pt required occasional min to  mod semantic and questioning  cues. Pt generated 4 complex sentences by answering "wh" questions. Pt required occasional min semantic cues to generate complex sentences. Targeted divergent naming of car parts - Sacha named 10 car parts with usual mod semantic cues.    03/27/23 (eval  day): reviewed goals, provided homework   SPEECH THERAPY DISCHARGE SUMMARY  Visits from Start of Care: ***  Current functional level related to goals / functional outcomes: ***   Remaining deficits: ***   Education / Equipment: ***   Patient agrees to discharge. Patient goals were {OP Goals:25702::"met"}. Patient is being discharged due to {OP Discharge Reasons:25703::"meeting the stated rehab goals."}.    PATIENT EDUCATION: Education details: See Today's Treatment, See patient instructions; aphasia compensations and education Person educated: Patient and Spouse Education method: Explanation, Demonstration, and Verbal cues Education comprehension: needs further  education   GOALS: Goals reviewed with patient? Yes  SHORT TERM GOALS: Target date: 05/01/23  Pt will  name 10 items in personally relevant category with occasional min A Baseline: Goal status: PARTIALLY MET  2.  Pt will employ verbal compensations for word finding in structured naming task with occasional min A Baseline:  Goal status: MET  3.  Pt will generate moderately complex sentences in structured tasks such as Film/video editor with occasional min A Baseline:  Goal status: NOT MET  4.  Pt will use compensations for aphasia to place order at unfamiliar restaurant with occasional min A Baseline:  Goal status: NOT ADDRESSED   5.  Pt will type/write simple 3-5 word sentence or response to train for texting  with occasional min A Baseline:  Goal status: PARTIALLY MET   LONG TERM GOALS: Target date: 06/19/23 (07/03/2023 for recert)  Pt will complete complex naming task with 90% accuracy and rare min A Baseline:  Goal status: IN PROGRESS  2.  Pt will use compensations for aphasia in conversation as needed 3/5 opportunities with occasional min A Baseline:  Goal status: MET  3.  Pt will use compensations for aphasia to place a simple business call and to order at 3 restaurants with rare min A from spouse. Baseline:  Goal status: MET  4.  Pt will respond to 3 texts with simple 3-5 word message or emoji Baseline:  Goal status: MET  5.  Pt will improve score on Communication Participation Item Bank by 3 points Baseline: 9 Goal status: IN PROGRESS   ASSESSMENT:  CLINICAL IMPRESSION: Patient is a 58 y.o. 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 He demonstrates improvements in communication since initiating ST, including ability to communicate more effectively in conversation. Targeted typing and discourse level today, with pt able to utilize compensations with occasional  min-mod A based on complexity. Pt will continue to benefit from skilled ST to maximize communication for safety, independence and QOL.  OBJECTIVE IMPAIRMENTS: Include aphasia.These impairments are limiting patient from return to work, Clinical cytogeneticist finances, household responsibilities, ADLs/IADLs, and effectively communicating at home and in community. Factors affecting potential to achieve goals and functional outcome are severity of impairments. Patient will benefit from skilled SLP services to address above impairments and improve overall function.  REHAB POTENTIAL: Good  PLAN:  SLP FREQUENCY: 2x/week  SLP DURATION: 12 weeks  PLANNED INTERVENTIONS: 92507 Treatment of speech (30 or 45 min) , Language facilitation, and Environmental controls    [employer], Student-SLP 06/26/2023, 10:55 AM

## 2023-06-28 IMAGING — CR DG CHEST 2V
2 series · 2 of 2 positions shown · non-contrast
Comparison: Radiograph 04/07/2015

CLINICAL DATA: Shortness of breath.  AFib.

EXAM:
CHEST - 2 VIEW

[chest pa]
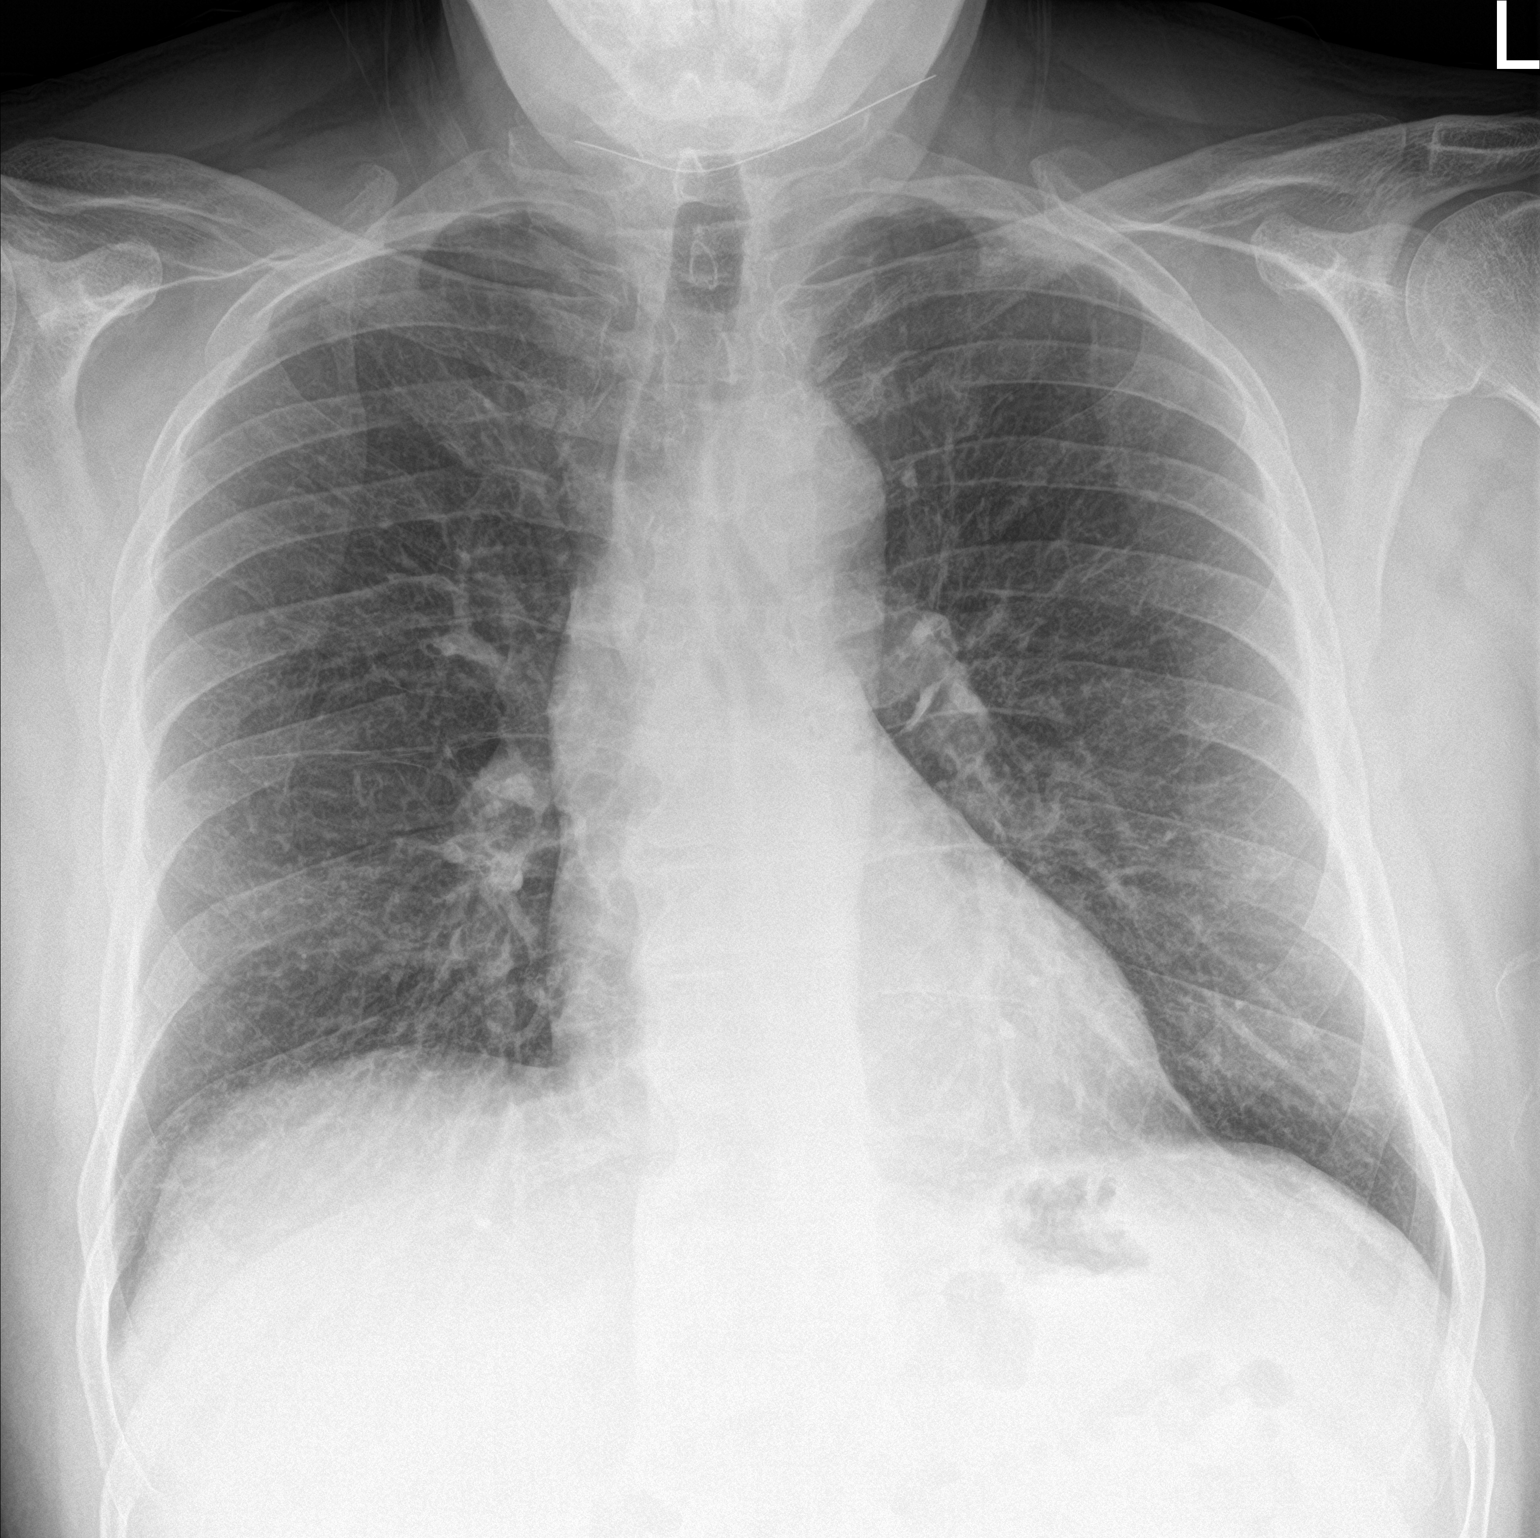

[chest lat]
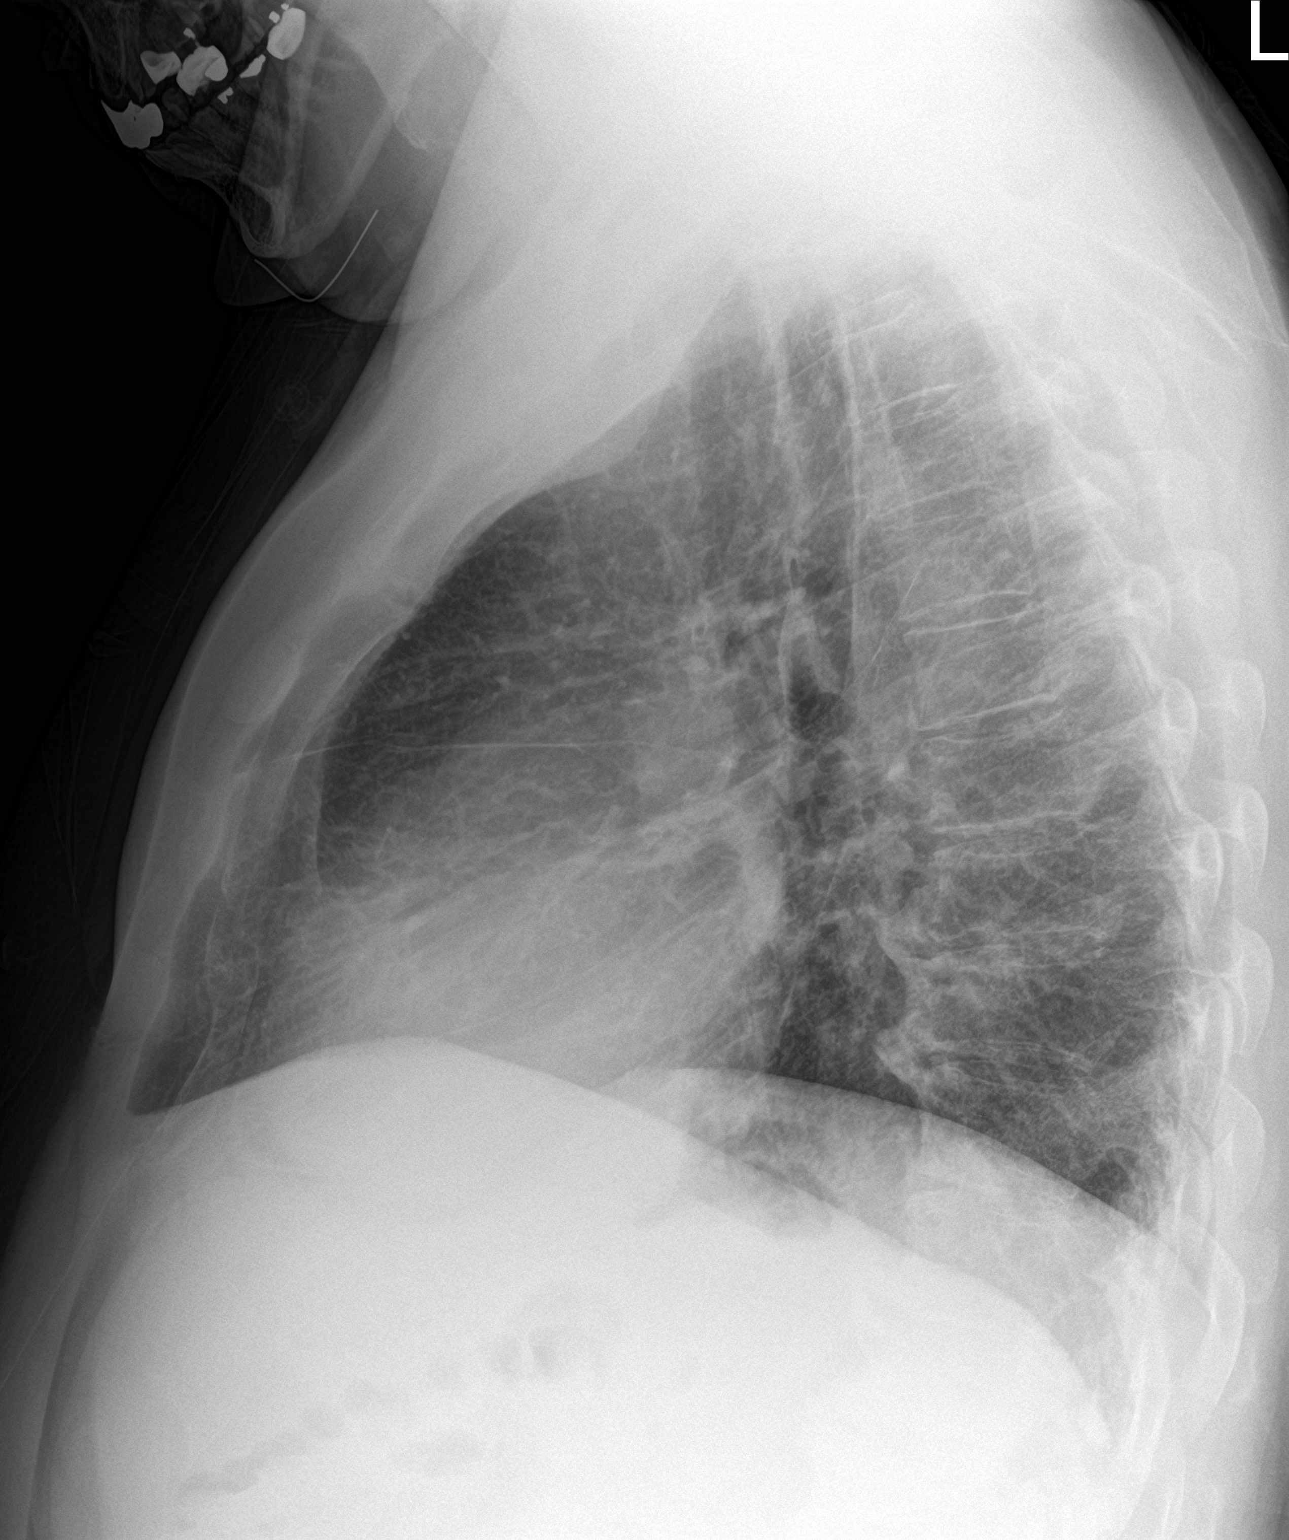

[2 of 2 positions shown; findings below may reference images not displayed]

FINDINGS: The cardiomediastinal contours are normal. Minimal bronchial
thickening. Pulmonary vasculature is normal. No consolidation,
pleural effusion, or pneumothorax. Degenerative spurring in the
lower thoracic spine. No acute osseous abnormalities are seen.
IMPRESSION: Minimal bronchial thickening.

## 2023-07-03 ENCOUNTER — Encounter: Payer: No Typology Code available for payment source | Admitting: Speech Pathology
